# Patient Record
Sex: Male | Born: 1950 | Race: Black or African American | Hispanic: No | Marital: Married | Smoking: Current every day smoker
Health system: Southern US, Community
[De-identification: ages and names within clinical notes are randomized; demographics above are authoritative.]

## PROBLEM LIST (undated history)

## (undated) DIAGNOSIS — I10 Essential (primary) hypertension: Secondary | ICD-10-CM

## (undated) DIAGNOSIS — E785 Hyperlipidemia, unspecified: Secondary | ICD-10-CM

## (undated) DIAGNOSIS — R7303 Prediabetes: Secondary | ICD-10-CM

---

## 2005-03-15 ENCOUNTER — Emergency Department: Payer: Self-pay | Admitting: Emergency Medicine
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
## 2016-09-14 ENCOUNTER — Ambulatory Visit: Payer: Self-pay | Admitting: Podiatry

## 2016-10-26 ENCOUNTER — Ambulatory Visit: Payer: No Typology Code available for payment source | Admitting: Podiatry

## 2016-11-02 ENCOUNTER — Ambulatory Visit: Payer: No Typology Code available for payment source | Admitting: Podiatry

## 2016-11-09 ENCOUNTER — Encounter: Payer: Self-pay | Admitting: Podiatry

## 2016-11-09 ENCOUNTER — Ambulatory Visit (INDEPENDENT_AMBULATORY_CARE_PROVIDER_SITE_OTHER): Payer: Non-veteran care | Admitting: Podiatry

## 2016-11-09 VITALS — BP 112/76 | HR 73 | Temp 98.6°F | Resp 16

## 2016-11-09 DIAGNOSIS — M79675 Pain in left toe(s): Secondary | ICD-10-CM

## 2016-11-09 DIAGNOSIS — M79674 Pain in right toe(s): Secondary | ICD-10-CM

## 2016-11-09 DIAGNOSIS — B351 Tinea unguium: Secondary | ICD-10-CM

## 2016-11-09 NOTE — Progress Notes (Signed)
   Subjective:    Patient ID: Jared Berry, male    DOB: 1950/12/21, 66 y.o.   MRN: [id number]  HPI this patient presents the office with chief complaint of long thick nails.  Nails are painful walking and wearing his shoes.  He was sent over to from the [place] for treatment of his thickened nails.  He presents the office today for preventative foot care services    Review of Systems  HENT: Positive for tinnitus.   Musculoskeletal: Positive for back pain.  Neurological: Positive for light-headedness.  All other systems reviewed and are negative.      Objective:   Physical Exam General Appearance  Alert, conversant and in no acute stress.  Vascular  Dorsalis pedis and posterior pulses are palpable  bilaterally.  Capillary return is within normal limits  Bilaterally. Temperature is within normal limits  Bilaterally  Neurologic  Senn-Weinstein monofilament wire test within normal limits  bilaterally. Muscle power  Within normal limits bilaterally.  Nails Thick disfigured discolored nails with subungual debride bilaterally from hallux to fifth toes bilaterally. No evidence of bacterial infection or drainage bilaterally.  Orthopedic  No limitations of motion of motion feet bilaterally.  No crepitus or effusions noted.  No bony pathology or digital deformities noted.  Skin  Asymptomatic heel callus  B/L  No signs of infections or ulcers noted.          Assessment & Plan:  Onychomycosis  B/L  IE  Debridement of nails.  RTC 3 months   Gardiner Barefoot DPM

## 2017-02-11 ENCOUNTER — Ambulatory Visit: Payer: No Typology Code available for payment source | Admitting: Podiatry

## 2019-07-25 ENCOUNTER — Other Ambulatory Visit: Payer: Self-pay

## 2019-07-25 ENCOUNTER — Ambulatory Visit (INDEPENDENT_AMBULATORY_CARE_PROVIDER_SITE_OTHER): Payer: Non-veteran care | Admitting: Podiatry

## 2019-07-25 DIAGNOSIS — M7742 Metatarsalgia, left foot: Secondary | ICD-10-CM | POA: Diagnosis not present

## 2019-07-25 DIAGNOSIS — M79674 Pain in right toe(s): Secondary | ICD-10-CM

## 2019-07-25 DIAGNOSIS — M79675 Pain in left toe(s): Secondary | ICD-10-CM | POA: Diagnosis not present

## 2019-07-25 DIAGNOSIS — Q666 Other congenital valgus deformities of feet: Secondary | ICD-10-CM

## 2019-07-25 DIAGNOSIS — B351 Tinea unguium: Secondary | ICD-10-CM | POA: Diagnosis not present

## 2019-07-27 ENCOUNTER — Encounter: Payer: Self-pay | Admitting: Podiatry

## 2019-07-27 NOTE — Progress Notes (Signed)
Subjective:  Patient ID: Jared Berry, male    DOB: [birth year],  MRN: [id number]  Chief Complaint  Patient presents with  . Nail Problem    pt is here for nail fungus of both big toenails, pt states that he also has possible neuropathy of the the left plantar forefoot as well.   69 y.o. male returns for the above complaint.  Patient presents with thickened elongated dystrophic toenails x10.  They are painful to touch.  Painful when ambulating.  He would like to have them debrided down.  He also has secondary complaint of metatarsalgia to the left side.  Patient states he has a lot of pain in the ball of foot.  Especially when pain.  Patient states that he has pain with pressure.  Patient states that 7 out of 10 pain to the left foot.  Patient would also like to discuss orthotics for flatfoot structure.  He denies any other acute complaints  Objective:  There were no vitals filed for this visit. Podiatric Exam: Vascular: dorsalis pedis and posterior tibial pulses are palpable bilateral. Capillary return is immediate. Temperature gradient is WNL. Skin turgor WNL  Sensorium: Normal Semmes Weinstein monofilament test. Normal tactile sensation bilaterally. Nail Exam: Pt has thick disfigured discolored nails with subungual debris noted bilateral entire nail hallux through fifth toenails.  Pain on palpation to the nails. Ulcer Exam: There is no evidence of ulcer or pre-ulcerative changes or infection. Orthopedic Exam: Muscle tone and strength are WNL. No limitations in general ROM. No crepitus or effusions noted. HAV  B/L.  Hammer toes 2-5  B/L.  Severe pes planovalgus deformity noted with calcaneal eversion too many toe sign and partially recreate the arch with dorsiflexion of the hallux.  Pain across the ball of the left foot.  No Mulder's click noted.  No other signs of neuroma noted in any of the interspaces.  No pain with range of motion of the digits 2 through 5. Skin: No Porokeratosis. No  infection or ulcers    Assessment & Plan:   1. Metatarsalgia of left foot   2. Pes planovalgus   3. Pain due to onychomycosis of toenails of both feet     Patient was evaluated and treated and all questions answered.  Left pes planus -I explained to the patient the etiology of pes planus deformity and various treatment options were extensively discussed.  I believe patient will benefit from custom-made orthotics to help support the arch of the foot as well as control the hindfoot motion.  Patient also has metatarsalgia of the left forefoot.  I believe incorporation of the metatarsal pad to the left side is definitely beneficial. -I will have him follow-up with Jared Berry for custom-made orthotics.  Left metatarsalgia -I explained patient the etiology of metatarsalgia and various treatment options were extensively discussed.  I believe patient will benefit from metatarsal pad to take the pressure off of the forefoot and therefore reduce the pain associated with it.  I am not worried about neuroma as clinically patient does not have any Mulder click or any other signs of neuromas in the interspaces. -Metatarsal pad was dispensed  Onychomycosis with pain  -Nails palliatively debrided as below. -Educated on self-care  Procedure: Nail Debridement Rationale: pain  Type of Debridement: manual, sharp debridement. Instrumentation: Nail nipper, rotary burr. Number of Nails: 10  Procedures and Treatment: Consent by patient was obtained for treatment procedures. The patient understood the discussion of treatment and procedures well. All questions were answered  thoroughly reviewed. Debridement of mycotic and hypertrophic toenails, 1 through 5 bilateral and clearing of subungual debris. No ulceration, no infection noted.  Return Visit-Office Procedure: Patient instructed to return to the office for a follow up visit 3 months for continued evaluation and treatment.  Jared Berry, DPM    No follow-ups  on file.

## 2019-08-09 ENCOUNTER — Ambulatory Visit: Payer: Non-veteran care | Admitting: Orthotics

## 2019-08-09 ENCOUNTER — Other Ambulatory Visit: Payer: Self-pay

## 2019-08-09 DIAGNOSIS — M7742 Metatarsalgia, left foot: Secondary | ICD-10-CM

## 2019-08-09 DIAGNOSIS — Q666 Other congenital valgus deformities of feet: Secondary | ICD-10-CM

## 2019-08-09 NOTE — Progress Notes (Signed)
Patient advised to contact [facility] department for Auth to get foot orthotics from [place] or [facility].

## 2019-08-15 ENCOUNTER — Ambulatory Visit (INDEPENDENT_AMBULATORY_CARE_PROVIDER_SITE_OTHER): Payer: Non-veteran care | Admitting: Podiatry

## 2019-08-15 ENCOUNTER — Other Ambulatory Visit: Payer: Self-pay

## 2019-08-15 DIAGNOSIS — L603 Nail dystrophy: Secondary | ICD-10-CM

## 2019-08-15 DIAGNOSIS — M79609 Pain in unspecified limb: Secondary | ICD-10-CM

## 2019-08-15 DIAGNOSIS — B351 Tinea unguium: Secondary | ICD-10-CM | POA: Diagnosis not present

## 2019-08-18 ENCOUNTER — Encounter: Payer: Self-pay | Admitting: Podiatry

## 2019-08-18 NOTE — Progress Notes (Signed)
  Subjective:  Patient ID: Jared Berry, male    DOB: May 02, 1950,  MRN: [id number]  Chief Complaint  Patient presents with  . Nail Problem    pt is here for a f/u of foot pain to the left foot.    69 y.o. male presents with the above complaint. Patient presents with thickened elongated dystrophic nail of the left hallux. Patient states that it has been causing him a lot of pain. He would like to remove the nail. He would also like to make apartment as this has been dealing with it for quite some time. Patient states he had a history of nail that was removed and it came right back. He denies any other acute complaints.   Review of Systems: Negative except as noted in the HPI. Denies N/V/F/Ch.  No past medical history on file. No current outpatient medications on file.  Social History   Tobacco Use  Smoking Status Current Every Day Smoker  . Packs/day: 2.00  . Types: Cigarettes  Smokeless Tobacco Never Used    No Known Allergies Objective:  There were no vitals filed for this visit. There is no height or weight on file to calculate BMI. Constitutional Well developed. Well nourished.  Vascular Dorsalis pedis pulses palpable bilaterally. Posterior tibial pulses palpable bilaterally. Capillary refill normal to all digits.  No cyanosis or clubbing noted. Pedal hair growth normal.  Neurologic Normal speech. Oriented to person, place, and time. Epicritic sensation to light touch grossly present bilaterally.  Dermatologic Pain on palpation of the entire/total nail on 1st digit of the left No other open wounds. No skin lesions.  Orthopedic: Normal joint ROM without pain or crepitus bilaterally. No visible deformities. No bony tenderness.   Radiographs: None Assessment:   1. Nail dystrophy   2. Pain due to onychomycosis of nail    Plan:  Patient was evaluated and treated and all questions answered.  Nail contusion/dystrophy hallux, left with underlying  onychomycosis -Patient elects to proceed with minor surgery to remove entire toenail today. Consent reviewed and signed by patient. -Entire/total nail excised. See procedure note. -Educated on post-procedure care including soaking. Written instructions provided and reviewed. -Patient to follow up in 2 weeks for nail check.  Procedure: Excision of entire/total nail with matricectomy Location: Left 1st toe digit Anesthesia: Lidocaine 1% plain; 1.5 mL and Marcaine 0.5% plain; 1.5 mL, digital block. Skin Prep: Betadine. Dressing: Silvadene; telfa; dry, sterile, compression dressing. Technique: Following skin prep, the toe was exsanguinated and a tourniquet was secured at the base of the toe. The affected nail border was freed and excised. Phenol matricectomy was performed in standard technique without complication. The tourniquet was then removed and sterile dressing applied. Disposition: Patient tolerated procedure well. Patient to return in 2 weeks for follow-up.   No follow-ups on file.

## 2019-10-26 ENCOUNTER — Encounter: Payer: Self-pay | Admitting: Podiatry

## 2019-10-26 ENCOUNTER — Other Ambulatory Visit: Payer: Self-pay

## 2019-10-26 ENCOUNTER — Ambulatory Visit (INDEPENDENT_AMBULATORY_CARE_PROVIDER_SITE_OTHER): Payer: No Typology Code available for payment source | Admitting: Podiatry

## 2019-10-26 DIAGNOSIS — L603 Nail dystrophy: Secondary | ICD-10-CM | POA: Diagnosis not present

## 2019-10-26 DIAGNOSIS — B351 Tinea unguium: Secondary | ICD-10-CM | POA: Diagnosis not present

## 2019-10-26 DIAGNOSIS — M722 Plantar fascial fibromatosis: Secondary | ICD-10-CM | POA: Diagnosis not present

## 2019-10-26 DIAGNOSIS — M79676 Pain in unspecified toe(s): Secondary | ICD-10-CM

## 2019-10-27 ENCOUNTER — Encounter: Payer: Self-pay | Admitting: Podiatry

## 2019-10-27 ENCOUNTER — Telehealth: Payer: Self-pay | Admitting: Podiatry

## 2019-10-27 NOTE — Telephone Encounter (Signed)
[facility] referral extension faxed on 10/26/19

## 2019-10-27 NOTE — Progress Notes (Signed)
  Subjective:  Patient ID: Jared Berry, male    DOB: Mar 29, 1950,  MRN: [id number]  Chief Complaint  Patient presents with  . Nail Problem    nail trim RFC    69 y.o. male presents with the above complaint.  Patient presents with complaint of left distal plantar fascial pain.  Patient states is very tight he can feel the band.  He states it hurts with ambulating it hurts right there.  He has not tried anything.  He is done some stretching exercises which has not helped.  He also has secondary complaint of thickened elongated dystrophic toenails x10.  He would like to have them debrided down as he is not able to do it himself.  He denies any other acute complaints.   Review of Systems: Negative except as noted in the HPI. Denies N/V/F/Ch.  No past medical history on file. No current outpatient medications on file.  Social History   Tobacco Use  Smoking Status Current Every Day Smoker  . Packs/day: 2.00  . Types: Cigarettes  Smokeless Tobacco Never Used    No Known Allergies Objective:  There were no vitals filed for this visit. There is no height or weight on file to calculate BMI. Constitutional Well developed. Well nourished.  Vascular Dorsalis pedis pulses palpable bilaterally. Posterior tibial pulses palpable bilaterally. Capillary refill normal to all digits.  No cyanosis or clubbing noted. Pedal hair growth normal.  Neurologic Normal speech. Oriented to person, place, and time. Epicritic sensation to light touch grossly present bilaterally.  Dermatologic Nail Exam: Pt has thick disfigured discolored nails with subungual debris noted bilateral entire nail hallux through fifth toenails.  Pain on palpation to the nails. No open wounds. No skin lesions.  Orthopedic: Normal joint ROM without pain or crepitus bilaterally. No visible deformities. Tender to palpation at the distal band of the plantar fascia left. No pain with calcaneal squeeze left. Ankle ROM diminished  range of motion left. Silfverskiold Test: positive left.   Radiographs: TNone  Assessment:  No diagnosis found. Plan:  Patient was evaluated and treated and all questions answered.  Plantar Fasciitis, left distal band - XR reviewed as above.  - Educated on icing and stretching. Instructions given.  - Injection delivered to the plantar fascia as below. - DME: None - Pharmacologic management: None  Procedure: Injection Tendon/Ligament Location: Left plantar fascia at the glabrous junction; medial approach. Skin Prep: alcohol Injectate: 0.5 cc 0.5% marcaine plain, 0.5 cc of 1% Lidocaine, 0.5 cc kenalog 10. Disposition: Patient tolerated procedure well. Injection site dressed with a band-aid.  Onychomycosis with pain with underlying nail dystrophy -Nails palliatively debrided as below. -Educated on self-care  Procedure: Nail Debridement Rationale: pain  Type of Debridement: manual, sharp debridement. Instrumentation: Nail nipper, rotary burr. Number of Nails: 10  Procedures and Treatment: Consent by patient was obtained for treatment procedures. The patient understood the discussion of treatment and procedures well. All questions were answered thoroughly reviewed. Debridement of mycotic and hypertrophic toenails, 1 through 5 bilateral and clearing of subungual debris. No ulceration, no infection noted.  Return Visit-Office Procedure: Patient instructed to return to the office for a follow up visit 3 months for continued evaluation and treatment.  Boneta Lucks, DPM    No follow-ups on file.    No follow-ups on file.

## 2019-12-26 ENCOUNTER — Telehealth: Payer: Self-pay | Admitting: Podiatry

## 2019-12-26 NOTE — Telephone Encounter (Signed)
Patient called in wanting appointment before referral ran out through the [place]. Estill Bamberg wanted me to let you know patient needs to be followed up because the referral isn't in the system for [place] to glance at, please advise

## 2019-12-27 ENCOUNTER — Ambulatory Visit: Payer: Non-veteran care | Admitting: Podiatry

## 2020-01-01 ENCOUNTER — Telehealth: Payer: Self-pay | Admitting: *Deleted

## 2020-01-01 NOTE — Telephone Encounter (Signed)
I left Jared Berry a message that his [employer] authorization has expired.  I informed him that we had sent a request in September but we still haven't received the authorization from them.  I explained that I faxed the request again today.  I informed him that he needs to initiate the request as well.  I reminded him that the authorization is required prior to his scheduled appointment for Wednesday.

## 2020-01-01 NOTE — Telephone Encounter (Signed)
"  I'm trying to check on me not running out of my [facility].  I'm trying to get another appointment before the time ran out."

## 2020-01-03 ENCOUNTER — Ambulatory Visit: Payer: Non-veteran care | Admitting: Podiatry

## 2020-01-03 NOTE — Telephone Encounter (Signed)
"  I got a call about my appointment today.  I don't have authorization from the [place]."  Yes, I left you a message and asked you to give the [place] a call.  Did you call them?  "No, I didn't."  I attempted to assist you by sending a request to the [place] but I haven't heard anything.  So, do you want to cancel your appointment for today.  "Yes, I can't afford to pay for it right now.  I'll call the [facility].  Once I get it authorized, I'll give you a call."

## 2020-01-25 ENCOUNTER — Ambulatory Visit: Payer: Non-veteran care | Admitting: Podiatry

## 2020-12-16 ENCOUNTER — Ambulatory Visit (INDEPENDENT_AMBULATORY_CARE_PROVIDER_SITE_OTHER): Payer: No Typology Code available for payment source | Admitting: Podiatry

## 2020-12-16 ENCOUNTER — Encounter: Payer: Self-pay | Admitting: Podiatry

## 2020-12-16 ENCOUNTER — Other Ambulatory Visit: Payer: Self-pay

## 2020-12-16 ENCOUNTER — Ambulatory Visit (INDEPENDENT_AMBULATORY_CARE_PROVIDER_SITE_OTHER): Payer: No Typology Code available for payment source

## 2020-12-16 DIAGNOSIS — D4101 Neoplasm of uncertain behavior of right kidney: Secondary | ICD-10-CM | POA: Insufficient documentation

## 2020-12-16 DIAGNOSIS — M778 Other enthesopathies, not elsewhere classified: Secondary | ICD-10-CM

## 2020-12-16 DIAGNOSIS — Z122 Encounter for screening for malignant neoplasm of respiratory organs: Secondary | ICD-10-CM | POA: Insufficient documentation

## 2020-12-16 DIAGNOSIS — R7303 Prediabetes: Secondary | ICD-10-CM | POA: Insufficient documentation

## 2020-12-16 DIAGNOSIS — K649 Unspecified hemorrhoids: Secondary | ICD-10-CM | POA: Insufficient documentation

## 2020-12-16 DIAGNOSIS — F17200 Nicotine dependence, unspecified, uncomplicated: Secondary | ICD-10-CM | POA: Insufficient documentation

## 2020-12-16 DIAGNOSIS — N281 Cyst of kidney, acquired: Secondary | ICD-10-CM | POA: Insufficient documentation

## 2020-12-16 DIAGNOSIS — R948 Abnormal results of function studies of other organs and systems: Secondary | ICD-10-CM | POA: Insufficient documentation

## 2020-12-16 DIAGNOSIS — H9319 Tinnitus, unspecified ear: Secondary | ICD-10-CM | POA: Insufficient documentation

## 2020-12-16 DIAGNOSIS — Z720 Tobacco use: Secondary | ICD-10-CM | POA: Insufficient documentation

## 2020-12-16 DIAGNOSIS — D2122 Benign neoplasm of connective and other soft tissue of left lower limb, including hip: Secondary | ICD-10-CM | POA: Diagnosis not present

## 2020-12-16 DIAGNOSIS — I25119 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: Secondary | ICD-10-CM | POA: Insufficient documentation

## 2020-12-16 DIAGNOSIS — G510 Bell's palsy: Secondary | ICD-10-CM | POA: Insufficient documentation

## 2020-12-16 DIAGNOSIS — Z8611 Personal history of tuberculosis: Secondary | ICD-10-CM | POA: Insufficient documentation

## 2020-12-16 DIAGNOSIS — IMO0002 Reserved for concepts with insufficient information to code with codable children: Secondary | ICD-10-CM | POA: Insufficient documentation

## 2020-12-16 DIAGNOSIS — M25552 Pain in left hip: Secondary | ICD-10-CM | POA: Insufficient documentation

## 2020-12-16 DIAGNOSIS — N529 Male erectile dysfunction, unspecified: Secondary | ICD-10-CM | POA: Insufficient documentation

## 2020-12-16 DIAGNOSIS — H2513 Age-related nuclear cataract, bilateral: Secondary | ICD-10-CM | POA: Insufficient documentation

## 2020-12-16 DIAGNOSIS — L408 Other psoriasis: Secondary | ICD-10-CM | POA: Insufficient documentation

## 2020-12-16 MED ORDER — TRIAMCINOLONE ACETONIDE 40 MG/ML IJ SUSP
40.0000 mg | Freq: Once | INTRAMUSCULAR | Status: AC
  Administered 2020-12-16: 40 mg

## 2020-12-16 NOTE — Progress Notes (Signed)
  Subjective:  Patient ID: Jared Berry, male    DOB: [birth year],  MRN: [id number] HPI Chief Complaint  Patient presents with   Foot Pain    Plantar forefoot left - aching, numbness x 1.5 year, using muscle rub and soaking   New Patient (Initial Visit)    70 y.o. male presents with the above complaint.   ROS: Denies fever chills nausea vomiting muscle aches pains calf pain back pain chest pain shortness of breath.  No past medical history on file. No past surgical history on file.  Current Outpatient Medications:    Multiple Vitamin (MULTIVITAMIN) capsule, Take 1 capsule by mouth daily., Disp: , Rfl:   No Known Allergies Review of Systems Objective:  There were no vitals filed for this visit.  General: Well developed, nourished, in no acute distress, alert and oriented x3   Dermatological: Skin is warm, dry and supple bilateral. Nails x 10 are well maintained; remaining integument appears unremarkable at this time. There are no open sores, no preulcerative lesions, no rash or signs of infection present.  Vascular: Dorsalis Pedis artery and Posterior Tibial artery pedal pulses are 2/4 bilateral with immedate capillary fill time. Pedal hair growth present. No varicosities and no lower extremity edema present bilateral.   Neruologic: Grossly intact via light touch bilateral. Vibratory intact via tuning fork bilateral. Protective threshold with Semmes Wienstein monofilament intact to all pedal sites bilateral. Patellar and Achilles deep tendon reflexes 2+ bilateral. No Babinski or clonus noted bilateral.   Musculoskeletal: No gross boney pedal deformities bilateral. No pain, crepitus, or limitation noted with foot and ankle range of motion bilateral. Muscular strength 5/5 in all groups tested bilateral.  Pain on palpation and end range of motion of the second metatarsophalangeal joint of the left foot.  Gait: Unassisted, Nonantalgic.    Radiographs:  Radiographs taken today do  not demonstrate any type of acute osseous abnormalities.  Does demonstrate an osseously mature individual.  Assessment & Plan:   Assessment: Probable capsulitis of the second metatarsal phalangeal joint with some neuropathic changes secondary to duration and inflammation around the joint.  Plan: Discussed appropriate shoe gear stretching exercise ice therapy shoe gear modifications injected around the joint with 10 mg Kenalog 5 mg Marcaine to the point maximal tenderness.  Tolerated procedure well I will follow-up with him as needed.     Lawerence Dery T. [place], [place]

## 2021-02-07 ENCOUNTER — Telehealth: Payer: Self-pay | Admitting: *Deleted

## 2021-02-07 NOTE — Telephone Encounter (Signed)
"  I want to see Dr. Milinda Pointer for a foot appointment as soon as possible."

## 2021-02-26 ENCOUNTER — Ambulatory Visit (INDEPENDENT_AMBULATORY_CARE_PROVIDER_SITE_OTHER): Payer: No Typology Code available for payment source | Admitting: Podiatry

## 2021-02-26 ENCOUNTER — Other Ambulatory Visit: Payer: Self-pay

## 2021-02-26 ENCOUNTER — Encounter: Payer: Self-pay | Admitting: Podiatry

## 2021-02-26 DIAGNOSIS — M778 Other enthesopathies, not elsewhere classified: Secondary | ICD-10-CM

## 2021-02-26 DIAGNOSIS — G5782 Other specified mononeuropathies of left lower limb: Secondary | ICD-10-CM

## 2021-02-26 MED ORDER — TRIAMCINOLONE ACETONIDE 40 MG/ML IJ SUSP
40.0000 mg | Freq: Once | INTRAMUSCULAR | Status: AC
  Administered 2021-02-26: 40 mg

## 2021-02-26 NOTE — Progress Notes (Signed)
He presents today for follow-up of his left foot and pain in it.  He states he is feeling much better than it was previously.  Objective: Vital signs are stable he is alert oriented x3 he has pain on palpation third interdigital space of the left foot consistent with neuroma and he also has tenderness on palpation at the level of the second metatarsal phalangeal joint dorsally.  This is consistent with capsulitis.  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  Plan: I injected the capsulitis today with 2 mg of dexamethasone local anesthetic while injecting the third interdigital space with 10 mg of Kenalog and local anesthetic.  I will follow-up with him in a few weeks to make sure he is healing well.  We did discuss appropriate shoe gear and I will follow-up with him at that time

## 2021-05-28 ENCOUNTER — Ambulatory Visit: Payer: No Typology Code available for payment source | Admitting: Podiatry

## 2021-06-30 ENCOUNTER — Ambulatory Visit: Payer: Self-pay | Admitting: Podiatry

## 2022-05-27 ENCOUNTER — Telehealth: Payer: Self-pay | Admitting: *Deleted

## 2022-06-23 NOTE — Telephone Encounter (Signed)
"  I'd like to see what I have to do to get my medical records for the [place]."

## 2024-02-14 ENCOUNTER — Emergency Department

## 2024-02-14 ENCOUNTER — Observation Stay

## 2024-02-14 ENCOUNTER — Observation Stay
Admission: EM | Admit: 2024-02-14 | Discharge: 2024-02-17 | Disposition: A | Discharge status: Home / Self Care | Attending: Student | Admitting: Student

## 2024-02-14 ENCOUNTER — Other Ambulatory Visit: Payer: Self-pay

## 2024-02-14 DIAGNOSIS — Z79899 Other long term (current) drug therapy: Secondary | ICD-10-CM | POA: Diagnosis not present

## 2024-02-14 DIAGNOSIS — R42 Dizziness and giddiness: Secondary | ICD-10-CM

## 2024-02-14 DIAGNOSIS — F17200 Nicotine dependence, unspecified, uncomplicated: Secondary | ICD-10-CM | POA: Diagnosis present

## 2024-02-14 DIAGNOSIS — I25119 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: Secondary | ICD-10-CM | POA: Diagnosis not present

## 2024-02-14 DIAGNOSIS — G459 Transient cerebral ischemic attack, unspecified: Secondary | ICD-10-CM | POA: Diagnosis not present

## 2024-02-14 DIAGNOSIS — R29704 NIHSS score 4: Secondary | ICD-10-CM

## 2024-02-14 DIAGNOSIS — R7303 Prediabetes: Secondary | ICD-10-CM | POA: Diagnosis present

## 2024-02-14 DIAGNOSIS — I479 Paroxysmal tachycardia, unspecified: Secondary | ICD-10-CM | POA: Insufficient documentation

## 2024-02-14 DIAGNOSIS — R2990 Unspecified symptoms and signs involving the nervous system: Secondary | ICD-10-CM | POA: Diagnosis present

## 2024-02-14 DIAGNOSIS — F17210 Nicotine dependence, cigarettes, uncomplicated: Secondary | ICD-10-CM | POA: Diagnosis not present

## 2024-02-14 DIAGNOSIS — M5416 Radiculopathy, lumbar region: Secondary | ICD-10-CM | POA: Diagnosis not present

## 2024-02-14 DIAGNOSIS — I639 Cerebral infarction, unspecified: Secondary | ICD-10-CM | POA: Diagnosis not present

## 2024-02-14 DIAGNOSIS — R531 Weakness: Secondary | ICD-10-CM | POA: Diagnosis present

## 2024-02-14 DIAGNOSIS — I10 Essential (primary) hypertension: Secondary | ICD-10-CM | POA: Insufficient documentation

## 2024-02-14 DIAGNOSIS — I4719 Other supraventricular tachycardia: Secondary | ICD-10-CM

## 2024-02-14 DIAGNOSIS — R29818 Other symptoms and signs involving the nervous system: Secondary | ICD-10-CM | POA: Diagnosis not present

## 2024-02-14 DIAGNOSIS — R350 Frequency of micturition: Secondary | ICD-10-CM | POA: Diagnosis not present

## 2024-02-14 HISTORY — DX: Essential (primary) hypertension: I10

## 2024-02-14 HISTORY — DX: Hyperlipidemia, unspecified: E78.5

## 2024-02-14 HISTORY — DX: Prediabetes: R73.03

## 2024-02-14 LAB — POTASSIUM: Potassium: 3.7 mmol/L (ref 3.5–5.1)

## 2024-02-14 LAB — DIFFERENTIAL
Abs Immature Granulocytes: 0.02 K/uL (ref 0.00–0.07)
Basophils Absolute: 0 K/uL (ref 0.0–0.1)
Basophils Relative: 1 %
Eosinophils Absolute: 0.1 K/uL (ref 0.0–0.5)
Eosinophils Relative: 1 %
Immature Granulocytes: 0 %
Lymphocytes Relative: 10 %
Lymphs Abs: 0.6 K/uL — ABNORMAL LOW (ref 0.7–4.0)
Monocytes Absolute: 0.7 K/uL (ref 0.1–1.0)
Monocytes Relative: 11 %
Neutro Abs: 4.6 K/uL (ref 1.7–7.7)
Neutrophils Relative %: 77 %

## 2024-02-14 LAB — COMPREHENSIVE METABOLIC PANEL WITH GFR
ALT: 8 U/L (ref 0–44)
AST: 30 U/L (ref 15–41)
Albumin: 3.6 g/dL (ref 3.5–5.0)
Alkaline Phosphatase: 77 U/L (ref 38–126)
Anion gap: 14 (ref 5–15)
BUN: 14 mg/dL (ref 8–23)
CO2: 22 mmol/L (ref 22–32)
Calcium: 9 mg/dL (ref 8.9–10.3)
Chloride: 100 mmol/L (ref 98–111)
Creatinine, Ser: 1.31 mg/dL — ABNORMAL HIGH (ref 0.61–1.24)
GFR, Estimated: 58 mL/min — ABNORMAL LOW
Glucose, Bld: 105 mg/dL — ABNORMAL HIGH (ref 70–99)
Potassium: 5.2 mmol/L — ABNORMAL HIGH (ref 3.5–5.1)
Sodium: 136 mmol/L (ref 135–145)
Total Bilirubin: 0.6 mg/dL (ref 0.0–1.2)
Total Protein: 7.2 g/dL (ref 6.5–8.1)

## 2024-02-14 LAB — URINALYSIS, W/ REFLEX TO CULTURE (INFECTION SUSPECTED)
Bacteria, UA: NONE SEEN
Bilirubin Urine: NEGATIVE
Glucose, UA: NEGATIVE mg/dL
Ketones, ur: 20 mg/dL — AB
Leukocytes,Ua: NEGATIVE
Nitrite: NEGATIVE
Protein, ur: NEGATIVE mg/dL
Specific Gravity, Urine: 1.023 (ref 1.005–1.030)
Squamous Epithelial / HPF: 0 /HPF (ref 0–5)
pH: 5 (ref 5.0–8.0)

## 2024-02-14 LAB — ETHANOL: Alcohol, Ethyl (B): <15 mg/dL

## 2024-02-14 LAB — CBC
HCT: 43.5 % (ref 39.0–52.0)
Hemoglobin: 14.5 g/dL (ref 13.0–17.0)
MCH: 29.7 pg (ref 26.0–34.0)
MCHC: 33.3 g/dL (ref 30.0–36.0)
MCV: 89.1 fL (ref 80.0–100.0)
Platelets: 230 K/uL (ref 150–400)
RBC: 4.88 MIL/uL (ref 4.22–5.81)
RDW: 15.9 % — ABNORMAL HIGH (ref 11.5–15.5)
WBC: 5.9 K/uL (ref 4.0–10.5)
nRBC: 0 % (ref 0.0–0.2)

## 2024-02-14 LAB — PROTIME-INR
INR: 1 (ref 0.8–1.2)
Prothrombin Time: 14 s (ref 11.4–15.2)

## 2024-02-14 LAB — URINE DRUG SCREEN
Amphetamines: NEGATIVE
Barbiturates: NEGATIVE
Benzodiazepines: POSITIVE — AB
Cocaine: NEGATIVE
Fentanyl: NEGATIVE
Methadone Scn, Ur: NEGATIVE
Opiates: NEGATIVE
Tetrahydrocannabinol: POSITIVE — AB

## 2024-02-14 LAB — APTT: aPTT: 36 s (ref 24–36)

## 2024-02-14 LAB — CK: Total CK: 158 U/L (ref 49–397)

## 2024-02-14 MED ORDER — SODIUM ZIRCONIUM CYCLOSILICATE 10 G PO PACK
10.0000 g | PACK | Freq: Once | ORAL | Status: AC
  Administered 2024-02-14: 10 g via ORAL
  Filled 2024-02-14: qty 1

## 2024-02-14 MED ORDER — NICOTINE 14 MG/24HR TD PT24
14.0000 mg | MEDICATED_PATCH | Freq: Every day | TRANSDERMAL | Status: DC
  Administered 2024-02-14 – 2024-02-17 (×4): 14 mg via TRANSDERMAL
  Filled 2024-02-14 (×4): qty 1

## 2024-02-14 MED ORDER — SODIUM CHLORIDE 0.9% FLUSH: 3.0000 mL | Freq: Once | INTRAVENOUS | Status: DC

## 2024-02-14 MED ORDER — STROKE: EARLY STAGES OF RECOVERY BOOK: Freq: Once | Status: AC

## 2024-02-14 MED ORDER — HEPARIN SODIUM (PORCINE) 5000 UNIT/ML IJ SOLN
5000.0000 [IU] | Freq: Three times a day (TID) | INTRAMUSCULAR | Status: DC
  Administered 2024-02-14 – 2024-02-15 (×3): 5000 [IU] via SUBCUTANEOUS
  Filled 2024-02-14 (×3): qty 1

## 2024-02-14 MED ORDER — SODIUM CHLORIDE 0.9 % IV BOLUS
1000.0000 mL | Freq: Once | INTRAVENOUS | Status: AC
  Administered 2024-02-14: 1000 mL via INTRAVENOUS

## 2024-02-14 MED ORDER — LACTATED RINGERS IV SOLN: INTRAVENOUS | Status: AC

## 2024-02-14 MED ORDER — IOHEXOL 350 MG/ML SOLN
75.0000 mL | Freq: Once | INTRAVENOUS | Status: AC | PRN
  Administered 2024-02-14: 75 mL via INTRAVENOUS

## 2024-02-14 NOTE — Progress Notes (Signed)
" °   02/14/24 1325  Spiritual Encounters  Type of Visit Attempt (pt unavailable)   Pt in CT when chaplain arrived and no family present "

## 2024-02-14 NOTE — ED Notes (Signed)
 Pt assisted to use urinal. 2x assist to help pt sit up and get back in bed

## 2024-02-14 NOTE — ED Triage Notes (Signed)
 Pt to ED via [facility] from home. On EMS arrival EMS requesting EDP to evaluate pt. Pt reports woke up at 0530 and 0600 became weak especially in right leg. EMS reports initially pt was walking to truck without assistance and felt his right leg became weaker in route. Pt reports on blood thinners. CODE STROKE called by EDP on arrival and pt taken to CT 2   CBG 111 134/97 92 HR  94% RA

## 2024-02-14 NOTE — Consult Note (Signed)
 NEUROLOGY CONSULT NOTE   Date of service: February 14, 2024 Patient Name: Jared Berry MRN:  [id number] DOB:  1950/09/30 Chief Complaint: Right leg weakness Requesting Provider: Suzanne Kirsch, MD  History of Present Illness  Jared Berry is a 74 y.o. male with no listed PMHx who presents from home after his right leg became weak, feeling as though it was going to give way from underneath him. The patient's LKN was sometime between 0500 and 0730, when he sat down on his porch. When he got up to stand, he noticed the symptoms. He denies any right arm weakness. Does not endorse any sensory numbness or new vision loss.   LKW: Between 0500 and 0730 Modified rankin score: 0 IV Thrombolysis: No: Outside of the time window EVT:  No: Presentation is not consistent with LVO   NIHSS components Score: Comment  1a Level of Conscious 0[x]  1[]  2[]  3[]      1b LOC Questions 0[]  1[x]  2[]      Knows his age. Gave February as the month. Answers Tuesday for the day of the week and 1925 for the year.   1c LOC Commands 0[x]  1[]  2[]       2 Best Gaze 0[x]  1[]  2[]       3 Visual 0[x]  1[]  2[]  3[]      4 Facial Palsy 0[x]  1[]  2[]  3[]      5a Motor Arm - left 0[x]  1[]  2[]  3[]  4[]  UN[]    5b Motor Arm - Right 0[x]  1[]  2[]  3[]  4[]  UN[]    6a Motor Leg - Left 0[]  1[x]  2[]  3[]  4[]  UN[]   Slight drift  6b Motor Leg - Right 0[]  1[x]  2[]  3[]  4[]  UN[]   Slight drift  7 Limb Ataxia 0[x]  1[]  2[]  UN[]      8 Sensory 0[]  1[x]  2[]  UN[]     Decreased FT sensation to LUE. Intact FT sensation to bilateral face and legs  9 Best Language 0[x]  1[]  2[]  3[]      10 Dysarthria 0[x]  1[]  2[]  UN[]      11 Extinct. and Inattention 0[x]  1[]  2[]       TOTAL:   4      ROS  Comprehensive ROS performed and pertinent positives documented in HPI    Past History  No past medical history on file.  No past surgical history on file.  Family History: No family history on file.  Social History  reports that he has been smoking  cigarettes. He has never used smokeless tobacco. He reports that he does not drink alcohol and does not use drugs.  Allergies[1]  Medications  Current Medications[2]  Vitals    BP (!) 134/97   Pulse 92   Resp 20   SpO2 94%    Physical Exam   Constitutional: Appears well-developed and well-nourished.  Psych: Affect appropriate to situation.  Eyes: No scleral injection.  HENT: No OP obstruction.  Head: Normocephalic.  Respiratory: Effort normal, non-labored breathing.  Skin: WDI.   Neurologic Examination   See NIHSS   Labs/Imaging/Neurodiagnostic studies    ASSESSMENT  Jared Berry is a 74 y.o. male presenting with acute onset of RLE weakness.  - Exam reveals slight drift of BLE and mild asymmetry of strength in lower extremities against resistance, rated as 4+/5 strength in knee extension and hip flexion on the left, 4/5 KE and HF on the right. There is mildly decreased FT sensation to his LEFT upper extremity.  - CT head:  No acute intracranial abnormality. ASPECTS of 10. Mild to  moderate chronic small vessel ischemic disease. - EKG: Sinus rhythm Ventricular premature complex LAE, consider biatrial enlargement LAD, consider left anterior fascicular block Anteroseptal infarct, age indeterminate Baseline wander in lead(s) V5 V6 - Impression: Acute onset of RLE weakness. DDx includes stroke, TIA and right lumbar radiculopathy  RECOMMENDATIONS  - MRI brain - If renal function is acceptable, obtain CTA of head and neck, otherwise, obtain MRA of head and carotid ultrasound - TTE - Cardiac telemetry - ASA 325 mg po x 1 now, then 81 mg po every day - Consider starting a statin pending fasting lipid panel - CK level - Permissive HTN x 24 hours - IVF - If MRI brain is negative, obtain MRI of L-spine to assess for possible radiculopathy - Also obtain plain films of right hip as patient endorses right hip pain with movement.  - PT/OT/Speech - HgbA1c - Frequent neuro  checks - NPO until passes stroke swallow screen ______________________________________________________________________    Bonney SHARK, Luka Stohr, MD [facility]     [1] No Known Allergies [2]  Current Facility-Administered Medications:    sodium chloride  flush (NS) 0.9 % injection 3 mL, 3 mL, Intravenous, Once, Mumma, Shannon, MD  Current Outpatient Medications:    Multiple Vitamin (MULTIVITAMIN) capsule, Take 1 capsule by mouth daily., Disp: , Rfl:

## 2024-02-14 NOTE — ED Provider Notes (Signed)
 "  Operating Room Services Provider Note    Event Date/Time   First MD Initiated Contact with Patient 02/14/24 1328     (approximate)   History   Code Stroke   HPI  Baldwin C Peart is a 74 y.o. male past medical history significant for hypertension, who presents to the emergency department for weakness.  Patient states that he went to stand up today after walking out to get to the porch and had a sudden onset of weakness.  Feels significantly more weak in his right leg when compared to his left leg.  Denies any falls or trauma.  States that he woke up this morning when in his normal state of health and around 5:00 this morning.  Walked outside 730 this morning.  Unclear if the patient then walked outside at 1030 or not.  Neighbor called because they found him laying down on the front porch.  Denies any head injury or loss of consciousness.  Complaining of weakness.  EMS stated that they initially took a glucose in his fingers and it was 33 but then checked in his earlobes and it was 111.  Patient states that he is on anticoagulation but unable to state what medications he takes.  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  Also states that he has a history of Bell's palsy and a facial droop to his right side.  States that he was going to the [place] today to have a routine blood pressure check.     Physical Exam   Triage Vital Signs: ED Triage Vitals  Encounter Vitals Group     BP      Girls Systolic BP Percentile      Girls Diastolic BP Percentile      Boys Systolic BP Percentile      Boys Diastolic BP Percentile      Pulse      Resp      Temp      Temp src      SpO2      Weight      Height      Head Circumference      Peak Flow      Pain Score      Pain Loc      Pain Education      Exclude from Growth Chart     Most recent vital signs: Vitals:   02/14/24 1333 02/14/24 1411  BP: (!) 134/97   Pulse: 92   Resp:  20   Temp:  98.1 F (36.7 C)  SpO2: 94%     Physical Exam Constitutional:      Appearance: He is well-developed.  HENT:     Head: Atraumatic.  Eyes:     Conjunctiva/sclera: Conjunctivae normal.     Comments: Unreactive left pupil right pupil equal and reactive  Cardiovascular:     Rate and Rhythm: Regular rhythm.  Pulmonary:     Effort: No respiratory distress.  Musculoskeletal:        General: Normal range of motion.     Cervical back: Normal range of motion.  Skin:    General: Skin is warm.     Capillary Refill: Capillary refill takes less than 2 seconds.  Neurological:     Mental Status: He is alert. Mental status is at baseline.     Cranial Nerves: Facial asymmetry present.     Sensory: No sensory deficit.  Motor: Weakness present.     Coordination: Coordination is intact.     Comments: Facial symmetry to the right side with a droop. (Baseline per the patient).  Drift to the right lower extremity when compared to the left lower extremity     IMPRESSION / MDM / ASSESSMENT AND PLAN / ED COURSE  I reviewed the triage vital signs and the nursing notes.  Differential diagnosis including concern for code stroke.  Last known well likely at 5:00 this morning when he got up.  Questionably on anticoagulation.  Activated code stroke and patient immediately taken back to CT scanner from EMS scanner.  Repeat glucose within normal limits.  Differential diagnosis including CVA, intracranial hemorrhage, electrolyte abnormality, dehydration, pneumonia, viral illness including COVID/influenza, urinary tract infection, dehydration  On arrival afebrile, hemodynamically stable.  EKG  I, Clotilda Punter, the attending physician, personally viewed and interpreted this ECG.  EKG showed normal sinus rhythm.  Appears to have atrial enlargement.  Significant artifact to the lateral leads.  No significant ST elevation or depression.  No findings of acute ischemia or dysrhythmia.  No  tachycardic or bradycardic dysrhythmias while on cardiac telemetry.  RADIOLOGY I independently reviewed imaging, my interpretation of imaging: CT scan of the head read as no acute findings  LABS (all labs ordered are listed, but only abnormal results are displayed) Labs interpreted as -    Labs Reviewed  CBC - Abnormal; Notable for the following components:      Result Value   RDW 15.9 (*)    All other components within normal limits  DIFFERENTIAL - Abnormal; Notable for the following components:   Lymphs Abs 0.6 (*)    All other components within normal limits  PROTIME-INR  APTT  COMPREHENSIVE METABOLIC PANEL WITH GFR  ETHANOL  URINE DRUG SCREEN  URINALYSIS, W/ REFLEX TO CULTURE (INFECTION SUSPECTED)  CK  I-STAT CREATININE, ED  CBG MONITORING, ED     MDM   Clinical Course as of 02/14/24 1458  Mon Feb 14, 2024  1348 Evaluated the patient in the hallway for sudden onset of weakness in the right leg when walking outside.  On my evaluation patient with intact sensation to bilateral upper and lower extremities.  Drift to the right lower extremity that was worse than the left lower extremity.  No pronator drift to the upper extremities.  Left pupil minimally reactive which she states this is baseline and is prior trauma.  Facial droop with asymmetry to the right face which she states is from a prior history of Bell's palsy.  Activated code stroke given his last known well was at 730 this morning with new right lower leg weakness.  Mediately taken back to CT scanner. [SM]    Clinical Course User Index [SM] Punter Clotilda, MD   No significant leukocytosis or anemia.  Lab work otherwise in process.  Patient was evaluated by neurology Dr. Merrianne -recommended no TNK given questionable last known well.  V AN negative and no concern for LVO, no thrombectomy candidate.  Did not feel that he needs an emergent CTA.  Reading neurology note recommended MRI and CTA and further stroke  workup.    PROCEDURES:  Critical Care performed: yes  .Critical Care  Performed by: Punter Clotilda, MD Authorized by: Punter Clotilda, MD   Critical care provider statement:    Critical care time (minutes):  30   Critical care time was exclusive of:  Separately billable procedures and treating other patients   Critical care  was necessary to treat or prevent imminent or life-threatening deterioration of the following conditions:  CNS failure or compromise   Critical care was time spent personally by me on the following activities:  Development of treatment plan with patient or surrogate, discussions with consultants, evaluation of patient's response to treatment, examination of patient, ordering and review of laboratory studies, ordering and review of radiographic studies, ordering and performing treatments and interventions, pulse oximetry, re-evaluation of patient's condition and review of old charts   Patient's presentation is most consistent with acute presentation with potential threat to life or bodily function.   MEDICATIONS ORDERED IN ED: Medications  sodium chloride  flush (NS) 0.9 % injection 3 mL ( Intravenous Canceled Entry 02/14/24 1338)  sodium chloride  0.9 % bolus 1,000 mL (has no administration in time range)    FINAL CLINICAL IMPRESSION(S) / ED DIAGNOSES   Final diagnoses:  Weakness     Rx / DC Orders   ED Discharge Orders     None        Note:  This document was prepared using Dragon voice recognition software and may include unintentional dictation errors.   Suzanne Kirsch, MD 02/14/24 1458  "

## 2024-02-14 NOTE — ED Notes (Addendum)
 1x attempt to call pt's sister Jared Berry. Voicebox was full to leave callback number

## 2024-02-14 NOTE — Progress Notes (Signed)
 CODE STROKE- PHARMACY COMMUNICATION   Time CODE STROKE called/page received: 1322  Time response to CODE STROKE was made (in person or via phone): immediately  Time Stroke Kit retrieved from [employer] (only if needed): n/a  Name of Provider/Nurse contacted:Dr. Merrianne  History reviewed. No pertinent past medical history. Prior to Admission medications  Medication Sig Start Date End Date Taking? Authorizing Provider  Multiple Vitamin (MULTIVITAMIN) capsule Take 1 capsule by mouth daily.   Yes [provider]  nicotine  (NICODERM CQ  - DOSED IN MG/24 HR) 7 mg/24hr patch Place 7 mg onto the skin daily. 01/10/24  Yes [provider]  amLODipine -benazepril (LOTREL) 5-10 MG capsule Take 1 capsule by mouth daily. Patient not taking: Reported on 02/14/2024 04/15/23   [provider]    Leonor JAYSON Argyle ,PharmD 02/14/2024  2:15 PM

## 2024-02-14 NOTE — H&P (Signed)
 " History and Physical    [place] FMW:[id number] DOB: 19-Jan-1951 DOA: 02/14/2024  DOS: the patient was seen and examined on 02/14/2024  PCP: System, Provider Not In   Patient coming from: Home  I have personally briefly reviewed patient's old medical records in [facility] and CareEverywhere  HPI:   Jared Berry is a 74 y.o. year old male with medical history of hypertension, hyperlipidemia, prediabetes, tobacco use disorder presented to the ED with right leg weakness.  States this occurred after waking up this morning.  This was reported as an acute change; stroke was initiated.  CT head was without any acute findings.  Patient was outside of any acute intervention and his symptoms were not consistent with LVO.  Neurology evaluated the patient and recommended admission for further workup.  Lab work showed CBC being unremarkable.  CMP with mild hyperkalemia, creatinine at 1.31 with baseline unknown.  CK normal.  UA without signs of infection, ethanol level normal.  CT head without any acute changes but mild to moderate chronic small vessel ischemic disease.  Given need for further workup, [facility] contacted for admission.  Review of Systems: As mentioned in the history of present illness. All other systems reviewed and are negative.   History reviewed. No pertinent past medical history.  History reviewed. No pertinent surgical history.   Allergies[1]  History reviewed. No pertinent family history.  Prior to Admission medications  Medication Sig Start Date End Date Taking? Authorizing Provider  Multiple Vitamin (MULTIVITAMIN) capsule Take 1 capsule by mouth daily.   Yes [provider]  nicotine  (NICODERM CQ  - DOSED IN MG/24 HR) 7 mg/24hr patch Place 7 mg onto the skin daily. 01/10/24  Yes [provider]  amLODipine -benazepril (LOTREL) 5-10 MG capsule Take 1 capsule by mouth daily. Patient not taking: Reported on 02/14/2024 04/15/23   [provider]     Social History:  reports that he has been smoking cigarettes. He has never used smokeless tobacco. He reports that he does not drink alcohol and does not use drugs. Lives by himself, smokes 2 packs/day, does not drink alcohol.  Independent ADLs and IADLs.   Physical Exam: Vitals:   02/14/24 1333 02/14/24 1400 02/14/24 1411  BP: (!) 134/97    Pulse: 92    Resp: 20    Temp:   98.1 F (36.7 C)  TempSrc:   Oral  SpO2: 94%    Weight:  81.4 kg     Gen: NAD HENT: NCAT CV: normal heart sounds Lung: CTAB Abd: No TTP, normal bowel sounds MSK: No asymmetry, good bulk and tone Neuro: alert and oriented, 4/5 upper and lower extremity strength.  Sensation intact.  No focal deficits appreciated.   Labs on Admission: I have personally reviewed following labs and imaging studies  CBC: Recent Labs  Lab 02/14/24 1322  WBC 5.9  NEUTROABS 4.6  HGB 14.5  HCT 43.5  MCV 89.1  PLT 230   Basic Metabolic Panel: Recent Labs  Lab 02/14/24 1322  NA 136  K 5.2*  CL 100  CO2 22  GLUCOSE 105*  BUN 14  CREATININE 1.31*  CALCIUM  9.0   GFR: CrCl cannot be calculated (Unknown ideal weight.). Liver Function Tests: Recent Labs  Lab 02/14/24 1322  AST 30  ALT 8  ALKPHOS 77  BILITOT 0.6  PROT 7.2  ALBUMIN 3.6   No results for input(s): LIPASE, AMYLASE in the last 168 hours. No results for input(s): AMMONIA in the last  168 hours. Coagulation Profile: Recent Labs  Lab 02/14/24 1322  INR 1.0   Cardiac Enzymes: Recent Labs  Lab 02/14/24 1322  CKTOTAL 158   BNP (last 3 results) No results for input(s): BNP in the last 8760 hours. HbA1C: No results for input(s): HGBA1C in the last 72 hours. CBG: No results for input(s): GLUCAP in the last 168 hours. Lipid Profile: No results for input(s): CHOL, HDL, LDLCALC, TRIG, CHOLHDL, LDLDIRECT in the last 72 hours. Thyroid Function Tests: No results for input(s): TSH, T4TOTAL, FREET4, T3FREE,  THYROIDAB in the last 72 hours. Anemia Panel: No results for input(s): VITAMINB12, FOLATE, FERRITIN, TIBC, IRON, RETICCTPCT in the last 72 hours. Urine analysis:    Component Value Date/Time   COLORURINE YELLOW (A) 02/14/2024 1538   APPEARANCEUR CLEAR (A) 02/14/2024 1538   LABSPEC 1.023 02/14/2024 1538   PHURINE 5.0 02/14/2024 1538   GLUCOSEU NEGATIVE 02/14/2024 1538   HGBUR MODERATE (A) 02/14/2024 1538   BILIRUBINUR NEGATIVE 02/14/2024 1538   KETONESUR 20 (A) 02/14/2024 1538   PROTEINUR NEGATIVE 02/14/2024 1538   NITRITE NEGATIVE 02/14/2024 1538   LEUKOCYTESUR NEGATIVE 02/14/2024 1538    Radiological Exams on Admission: I have personally reviewed images CT HEAD CODE STROKE WO CONTRAST Result Date: 02/14/2024 EXAM: CT HEAD WITHOUT CONTRAST 02/14/2024 01:25:57 PM TECHNIQUE: CT of the head was performed without the administration of intravenous contrast. Automated exposure control, iterative reconstruction, and/or weight based adjustment of the mA/kV was utilized to reduce the radiation dose to as low as reasonably achievable. COMPARISON: None available. CLINICAL HISTORY: Neuro deficit, acute, stroke suspected. FINDINGS: BRAIN AND VENTRICLES: There is no evidence of an acute infarct, intracranial hemorrhage, mass, midline shift, or extra-axial fluid collection. Mild to moderate enlargement of the lateral and third ventricles without significant temporal horn dilatation is favored to reflect central predominant cerebral atrophy rather than hydrocephalus. Patchy cerebral white matter hypodensities are nonspecific but compatible with mild to moderate chronic small vessel ischemic disease. ORBITS: No acute abnormality. SINUSES: Mild mucosal thickening in the paranasal sinuses. Clear mastoid air cells. SOFT TISSUES AND SKULL: No acute soft tissue abnormality. No skull fracture. [place] Stroke Program Early CT Score (ASPECTS) ----- Ganglionic (caudate, IC, lentiform nucleus, insula, M1-M3):  7 Supraganglionic (M4-M6): 3 Total: 10 IMPRESSION: 1. No acute intracranial abnormality. ASPECTS of 10. 2. Mild to moderate chronic small vessel ischemic disease. 3. These results were communicated to Dr. CHARLENA Shark at 1:43 PM on 02/14/2024 by secure text page via the [facility] messaging system. 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  No focal weakness appreciated so more consistent with TIA if imaging is negative. Neurology consulted, appreciate their recommendations.  Per neurology, will obtain lumbar spine MRI if MRI of the brain is negative.   - Allow for permissive HTN in the setting of suspected acute stroke versus TIA - ASA 325 mg once, and then daily afterwards - High Intensity Statin - Echocardiogram  - Carotid doppler or CTA head & neck  - A1C  - Lipid panel  - Tele monitoring  - SLP eval - PT/OT  Tobacco use  disorder: 2 packs/day.  Advised on smoking cessation.  Will order nicotine  replacement therapy.    Prediabetes: Monitor CBG daily.  CAD: High intensity statin as above.  Hypertension: Permissive hypertension as above, holding home antihypertensive.   VTE prophylaxis:  SQ Heparin   Diet: N.p.o. Code Status:  Full Code Telemetry:  Admission status: Observation, Telemetry bed Patient is from: Home Anticipated d/c is to: Home Anticipated d/c is in: 1-2 days   Family Communication: Updated at bedside  Consults called: Neurology   Severity of Illness: The appropriate patient status for this patient is OBSERVATION.  Observation status is judged to be reasonable and necessary in order to provide the required intensity of service to ensure the patient's safety. The patient's presenting symptoms, physical exam findings, and initial radiographic and laboratory data in the context of their medical condition is felt to place them at decreased risk for further clinical deterioration. Furthermore, it is anticipated that the patient will be medically stable for discharge from the hospital within 2 midnights of admission.    Morene Bathe, MD [facility] DEL. [facility]     [1] No Known Allergies  "

## 2024-02-14 NOTE — ED Notes (Signed)
 Pt's sister Dometrice updated

## 2024-02-14 NOTE — Code Documentation (Signed)
 Stroke Response Nurse Documentation Code Documentation  Elva C Manley is a 74 y.o. male arriving to [facility] via [facility] on 02/14/2024 with past medical hx of current smoker and left eye trauma. Per EMS, and pt, he takes anticoagulant, but unsure what it is. Code stroke was activated by ED.   Patient from home where he was LKW unclear, between 0500 and 0730 and now complaining of right leg weakness. Patient woke up normal at 0500. EMS called by neighbor. Noted to have more weakness in right leg. Patient endorses that he was normal when he woke up at 0500 and noticed he was more weak around 0600 when he just got weak in the knees.  Endorses right knee weakness on exam.  Stroke team meets patient in CT. Labs drawn and patient cleared for CT by Dr. Suzanne. Patient to CT with team. NIHSS 4, see documentation for details and code stroke times. Patient with disoriented, bilateral leg weakness, and left decreased sensation on exam. The following imaging was completed:  CT Head. Patient is not a candidate for IV Thrombolytic due to outside window, per MD. Patient is not a candidate for IR due to exam not consistent with LVO, per MD.   Care Plan: every 2 hours NIHSS, swallow screen per order.   Process Delays Noted: n/a  Bedside handoff with ED RN Burnard MICAEL Burnard KANDICE Hershel  Stroke Response RN

## 2024-02-14 NOTE — ED Notes (Signed)
Code  stroke  called  to  carelink per  Dr  Jori Moll  MD

## 2024-02-15 ENCOUNTER — Observation Stay
Admit: 2024-02-15 | Discharge: 2024-02-15 | Disposition: A | Discharge status: Home / Self Care | Attending: Internal Medicine | Admitting: Internal Medicine

## 2024-02-15 ENCOUNTER — Observation Stay

## 2024-02-15 DIAGNOSIS — R531 Weakness: Principal | ICD-10-CM

## 2024-02-15 LAB — BASIC METABOLIC PANEL WITH GFR
Anion gap: 14 (ref 5–15)
BUN: 11 mg/dL (ref 8–23)
CO2: 22 mmol/L (ref 22–32)
Calcium: 9.6 mg/dL (ref 8.9–10.3)
Chloride: 99 mmol/L (ref 98–111)
Creatinine, Ser: 1.08 mg/dL (ref 0.61–1.24)
GFR, Estimated: >60 mL/min
Glucose, Bld: 130 mg/dL — ABNORMAL HIGH (ref 70–99)
Potassium: 4 mmol/L (ref 3.5–5.1)
Sodium: 135 mmol/L (ref 135–145)

## 2024-02-15 LAB — ECHOCARDIOGRAM COMPLETE BUBBLE STUDY
AR max vel: 3.28 cm2
AV Area VTI: 3.14 cm2
AV Area mean vel: 2.93 cm2
AV Mean grad: 2 mmHg
AV Peak grad: 4.1 mmHg
Ao pk vel: 1.01 m/s
Area-P 1/2: 7.99 cm2
S' Lateral: 2.6 cm

## 2024-02-15 LAB — PHOSPHORUS: Phosphorus: 2.1 mg/dL — ABNORMAL LOW (ref 2.5–4.6)

## 2024-02-15 LAB — URINALYSIS, COMPLETE (UACMP) WITH MICROSCOPIC
Bilirubin Urine: NEGATIVE
Glucose, UA: NEGATIVE mg/dL
Ketones, ur: NEGATIVE mg/dL
Leukocytes,Ua: NEGATIVE
Nitrite: NEGATIVE
Protein, ur: NEGATIVE mg/dL
Specific Gravity, Urine: 1.011 (ref 1.005–1.030)
pH: 5 (ref 5.0–8.0)

## 2024-02-15 LAB — TSH: TSH: 2.18 u[IU]/mL (ref 0.350–4.500)

## 2024-02-15 LAB — LIPID PANEL
Cholesterol: 141 mg/dL (ref 0–200)
HDL: 33 mg/dL — ABNORMAL LOW
LDL Cholesterol: 91 mg/dL (ref 0–99)
Total CHOL/HDL Ratio: 4.2 ratio
Triglycerides: 82 mg/dL
VLDL: 16 mg/dL (ref 0–40)

## 2024-02-15 LAB — MAGNESIUM: Magnesium: 2 mg/dL (ref 1.7–2.4)

## 2024-02-15 LAB — HEMOGLOBIN A1C
Hgb A1c MFr Bld: 5.6 % (ref 4.8–5.6)
Mean Plasma Glucose: 114.02 mg/dL

## 2024-02-15 MED ORDER — ADENOSINE 6 MG/2ML IV SOLN
6.0000 mg | Freq: Once | INTRAVENOUS | Status: DC
  Filled 2024-02-15: qty 2

## 2024-02-15 MED ORDER — ROSUVASTATIN CALCIUM 20 MG PO TABS
20.0000 mg | ORAL_TABLET | Freq: Every day | ORAL | Status: DC
  Administered 2024-02-15 – 2024-02-16 (×2): 20 mg via ORAL
  Filled 2024-02-15 (×2): qty 1

## 2024-02-15 MED ORDER — K PHOS MONO-SOD PHOS DI & MONO 155-852-130 MG PO TABS
500.0000 mg | ORAL_TABLET | Freq: Three times a day (TID) | ORAL | Status: AC
  Administered 2024-02-15 (×3): 500 mg via ORAL
  Filled 2024-02-15 (×3): qty 2

## 2024-02-15 MED ORDER — ASPIRIN 325 MG PO TABS
325.0000 mg | ORAL_TABLET | Freq: Once | ORAL | Status: AC
  Administered 2024-02-15: 325 mg via ORAL
  Filled 2024-02-15: qty 1

## 2024-02-15 MED ORDER — ENOXAPARIN SODIUM 40 MG/0.4ML IJ SOSY
40.0000 mg | PREFILLED_SYRINGE | Freq: Every evening | INTRAMUSCULAR | Status: DC
  Administered 2024-02-15 – 2024-02-16 (×2): 40 mg via SUBCUTANEOUS
  Filled 2024-02-15 (×2): qty 0.4

## 2024-02-15 MED ORDER — AMLODIPINE BESYLATE 5 MG PO TABS
5.0000 mg | ORAL_TABLET | Freq: Every day | ORAL | Status: DC
  Administered 2024-02-15: 5 mg via ORAL
  Filled 2024-02-15 (×2): qty 1

## 2024-02-15 MED ORDER — ASPIRIN 81 MG PO TBEC
81.0000 mg | DELAYED_RELEASE_TABLET | Freq: Every day | ORAL | Status: DC
  Administered 2024-02-16 – 2024-02-17 (×2): 81 mg via ORAL
  Filled 2024-02-15 (×2): qty 1

## 2024-02-15 NOTE — Plan of Care (Signed)

## 2024-02-15 NOTE — Progress Notes (Signed)
*  PRELIMINARY RESULTS* Echocardiogram 2D Echocardiogram has been performed.  Jared Berry 02/15/2024, 10:36 AM

## 2024-02-15 NOTE — Progress Notes (Signed)
 [facility] Progress Note  Patient: Jared Berry    FMW:[id number]  DOA: 02/14/2024     Date of Service: the patient was seen and examined on 02/15/2024  Chief Complaint  Patient presents with   Code Stroke   Brief hospital course:  Jared Berry is a 74 y.o. year old male with medical history of hypertension, hyperlipidemia, prediabetes, tobacco use disorder presented to the ED with right leg weakness.  States this occurred after waking up this morning.  This was reported as an acute change; stroke was initiated.  CT head was without any acute findings.  Patient was outside of any acute intervention and his symptoms were not consistent with LVO.  Neurology evaluated the patient and recommended admission for further workup.  Lab work showed CBC being unremarkable.  CMP with mild hyperkalemia, creatinine at 1.31 with baseline unknown.  CK normal.  UA without signs of infection, ethanol level normal.  CT head without any acute changes but mild to moderate chronic small vessel ischemic disease.  Given need for further workup, [facility] contacted for admission.     Assessment and Plan:  # Right lower extremity weakness Acute CVA ruled out, MRI negative  Pt with symptoms of weakness that started acutely and concern for stroke versus TIA.  No focal weakness appreciated so more consistent with TIA if imaging is negative. Neurology consulted, appreciate their recommendations.  - Allow for permissive HTN in the setting of suspected acute stroke versus TIA - ASA 325 mg once, continue aspirin  81 mg and Crestor  20 mg p.o. daily - High Intensity Statin - Echocardiogram LVEF 60-65%, no WMA, grade 1 DD, negative PFO - CTA head & neck: Extensive aortic arch ulceration soft plaque without dissection.  Negative LVO - A1C 5.6 nondiabetic range - Lipid panel LDL 91 - Tele monitoring  - SLP eval - PT/OT MRI L-spine: 1. Right eccentric disc bulge at L5-S1 contacts the descending right S1 nerve root, with  mild right greater than left neural foraminal narrowing. 2. Incompletely evaluated right renal and hepatic T2 hyperintensities, which may represent cysts. Follow spine surgery, secure chat done with Dr. Katrina     Tobacco use disorder: 2 packs/day.  Advised on smoking cessation.  Will order nicotine  replacement therapy.     Prediabetes: Monitor CBG daily.   CAD: High intensity statin as above.   Hypertension: Patient was not taking any medication at home 1/6 started amlodipine  5 mg p.o. daily  Hypophosphatemia, Phos repleted. Monitor electrolytes daily and replete as needed.  Urinary frequency, patient is asking to check his urine Follow UA and urine culture Check bladder scan to rule out urinary retention and overflow incontinence   Body mass index is 24.04 kg/m.  Interventions:   Diet: Heart healthy diet DVT Prophylaxis: Subcutaneous Lovenox    Advance goals of care discussion: Full code  Family Communication: family was not present at bedside, at the time of interview.  The pt provided permission to discuss medical plan with the family. Opportunity was given to ask question and all questions were answered satisfactorily.   Disposition:  Pt is from Home, admitted with right lower extremity weakness, still has weakness, which precludes a safe discharge. Discharge to home with home PT versus SNF, TBD after PT and OT eval, when stable, most likely tomorrow a.m. if cleared by spine surgery.  Subjective: No significant events overnight.  Patient is still complaining of weakness in the right lower extremity and feels that it is because of his spine  problem.  Denied any other complaints.  Physical Exam: General: NAD, lying comfortably Appear in no distress, affect appropriate Eyes: PERRLA ENT: Oral Mucosa Clear, moist  Neck: no JVD,  Cardiovascular: S1 and S2 Present, no Murmur,  Respiratory: good respiratory effort, Bilateral Air entry equal and Decreased, no  Crackles, no wheezes Abdomen: Bowel Sound present, Soft and no tenderness,  Skin: no rashes Extremities: no Pedal edema, no calf tenderness Neurologic: RLE power 4/5, no any other focal deficits Gait not checked due to patient safety concerns  Vitals:   02/15/24 0000 02/15/24 0400 02/15/24 0800 02/15/24 1200  BP: 136/64 (!) 143/79 (!) 149/76 (!) 148/85  Pulse:  71 70 72  Resp: 18 19  16   Temp: 98.9 F (37.2 C) 98.8 F (37.1 C) 99.3 F (37.4 C) 98.9 F (37.2 C)  TempSrc: Oral  Oral Oral  SpO2: 96% 93% 94% 98%  Weight:      Height:        Intake/Output Summary (Last 24 hours) at 02/15/2024 1508 Last data filed at 02/15/2024 0900 Gross per 24 hour  Intake 120 ml  Output --  Net 120 ml   Filed Weights   02/14/24 1400 02/14/24 2005  Weight: 81.4 kg 78.2 kg    Data Reviewed: I have personally reviewed and interpreted daily labs, tele strips, imagings as discussed above. I reviewed all nursing notes, pharmacy notes, vitals, pertinent old records I have discussed plan of care as described above with RN and patient/family.  CBC: Recent Labs  Lab 02/14/24 1322  WBC 5.9  NEUTROABS 4.6  HGB 14.5  HCT 43.5  MCV 89.1  PLT 230   Basic Metabolic Panel: Recent Labs  Lab 02/14/24 1322 02/14/24 2319 02/15/24 0945  NA 136  --  135  K 5.2* 3.7 4.0  CL 100  --  99  CO2 22  --  22  GLUCOSE 105*  --  130*  BUN 14  --  11  CREATININE 1.31*  --  1.08  CALCIUM  9.0  --  9.6  MG  --   --  2.0  PHOS  --   --  2.1*    Studies: ECHOCARDIOGRAM COMPLETE BUBBLE STUDY Result Date: 02/15/2024    ECHOCARDIOGRAM REPORT   Patient Name:   Jared Berry Date of Exam: 02/15/2024 Medical Rec #:  [id number]       Height:       71.0 in Accession #:    [id number]      Weight:       172.4 lb Date of Birth:  07-29-1950       BSA:          1.980 m Patient Age:    73 years        BP:           143/79 mmHg Patient Gender: M               HR:           71 bpm. Exam Location:  [facility] Procedure: 2D Echo,  Cardiac Doppler, Color Doppler and Saline Contrast Bubble            Study (Both Spectral and Color Flow Doppler were utilized during            procedure).                                 MODIFIED  REPORT:   This report was modified by Cara JONETTA Lovelace MD on 02/15/2024 due to Negative                                  bubble study.  Indications:     Stroke 434.91 / I63.9  History:         Patient has no prior history of Echocardiogram examinations. No                  medical histories on file.  Sonographer:     Christopher Furnace Referring Phys:  [id number] [facility] Diagnosing Phys: Cara JONETTA Lovelace MD  Sonographer Comments: Technically challenging study due to limited acoustic windows and no subcostal window. IMPRESSIONS  1. Left ventricular ejection fraction, by estimation, is 60 to 65%. The left ventricle has normal function. The left ventricle has no regional wall motion abnormalities. Left ventricular diastolic parameters are consistent with Grade I diastolic dysfunction (impaired relaxation).  2. Right ventricular systolic function is normal. The right ventricular size is normal.  3. The mitral valve is normal in structure. Trivial mitral valve regurgitation.  4. The aortic valve is normal in structure. Aortic valve regurgitation is not visualized.  5. Agitated saline contrast bubble study was negative, with no evidence of any interatrial shunt. FINDINGS  Left Ventricle: Left ventricular ejection fraction, by estimation, is 60 to 65%. The left ventricle has normal function. The left ventricle has no regional wall motion abnormalities. Strain was performed and the global longitudinal strain is indeterminate. The left ventricular internal cavity size was normal in size. There is no left ventricular hypertrophy. Left ventricular diastolic parameters are consistent with Grade I diastolic dysfunction (impaired relaxation). Right Ventricle: The right ventricular size is normal. No increase in right ventricular wall  thickness. Right ventricular systolic function is normal. Left Atrium: Left atrial size was normal in size. Right Atrium: Right atrial size was normal in size. Pericardium: There is no evidence of pericardial effusion. Mitral Valve: The mitral valve is normal in structure. Trivial mitral valve regurgitation. Tricuspid Valve: The tricuspid valve is normal in structure. Tricuspid valve regurgitation is trivial. Aortic Valve: The aortic valve is normal in structure. Aortic valve regurgitation is not visualized. Aortic valve mean gradient measures 2.0 mmHg. Aortic valve peak gradient measures 4.1 mmHg. Aortic valve area, by VTI measures 3.14 cm. Pulmonic Valve: The pulmonic valve was normal in structure. Pulmonic valve regurgitation is not visualized. Aorta: The ascending aorta was not well visualized. IAS/Shunts: No atrial level shunt detected by color flow Doppler. Agitated saline contrast was given intravenously to evaluate for intracardiac shunting. Agitated saline contrast bubble study was negative, with no evidence of any interatrial shunt. Additional Comments: 3D was performed not requiring image post processing on an independent workstation and was indeterminate.  LEFT VENTRICLE PLAX 2D LVIDd:         4.00 cm   Diastology LVIDs:         2.60 cm   LV e' medial:    11.40 cm/s LV PW:         1.10 cm   LV E/e' medial:  8.3 LV IVS:        1.20 cm   LV e' lateral:   19.60 cm/s LVOT diam:     2.30 cm   LV E/e' lateral: 4.8 LV SV:         33 LV SV Index:  17 LVOT Area:     4.15 cm  RIGHT VENTRICLE RV Basal diam:  2.90 cm RV Mid diam:    2.50 cm RV S prime:     11.60 cm/s TAPSE (M-mode): 2.5 cm LEFT ATRIUM             Index        RIGHT ATRIUM           Index LA Vol (A2C):   22.6 ml 11.42 ml/m  RA Area:     18.90 cm LA Vol (A4C):   16.0 ml 8.08 ml/m   RA Volume:   52.80 ml  26.67 ml/m LA Biplane Vol: 19.7 ml 9.95 ml/m  AORTIC VALVE AV Area (Vmax):    3.28 cm AV Area (Vmean):   2.93 cm AV Area (VTI):     3.14  cm AV Vmax:           101.00 cm/s AV Vmean:          70.600 cm/s AV VTI:            0.104 m AV Peak Grad:      4.1 mmHg AV Mean Grad:      2.0 mmHg LVOT Vmax:         79.70 cm/s LVOT Vmean:        49.800 cm/s LVOT VTI:          0.079 m LVOT/AV VTI ratio: 0.76  AORTA Ao Root diam: 4.20 cm MITRAL VALVE                TRICUSPID VALVE MV Area (PHT): 7.99 cm     TR Peak grad:   28.1 mmHg MV Decel Time: 95 msec      TR Vmax:        265.00 cm/s MV E velocity: 94.90 cm/s MV A velocity: 122.00 cm/s  SHUNTS MV E/A ratio:  0.78         Systemic VTI:  0.08 m                             Systemic Diam: 2.30 cm Cara JONETTA Lovelace MD Electronically signed by Cara JONETTA Lovelace MD Signature Date/Time: 02/15/2024/12:38:41 PM    Final (Updated)    MR LUMBAR SPINE WO CONTRAST Result Date: 02/15/2024 EXAM: MRI LUMBAR SPINE 02/15/2024 11:06:36 AM TECHNIQUE: Multiplanar multisequence MRI of the lumbar spine was performed without the administration of intravenous contrast. COMPARISON: None available. CLINICAL HISTORY: Lumbar radiculopathy, no red flags, no prior management. FINDINGS: BONES AND ALIGNMENT: There is straightening of the normal lumbar lordosis without significant vertebral column listhesis. There is mild intervertebral disc height and signal loss most pronounced at L5-S1. Normal vertebral body heights. Bone marrow signal is unremarkable. SPINAL CORD: The conus medullaris terminates at the L1-L2 level. SOFT TISSUES: No paraspinal mass. L1-L2: No significant disc herniation. No spinal canal stenosis or neural foraminal narrowing. Mild bilateral facet arthropathy. L2-L3: There is minimal disc bulge and mild bilateral facet arthropathy without significant spinal canal or neural foraminal stenosis. L3-L4: There is minimal disc bulge and mild bilateral facet arthropathy without significant spinal stenosis. There is mild bilateral neural foraminal narrowing. L4-L5: There is disc bulge slightly eccentric to the right and mild  bilateral facet arthropathy without significant spinal stenosis. There is mild bilateral neural foraminal narrowing. L5-S1: There is disc bulge eccentric to the right with contact of the descending right S1 nerve roots. Mild bilateral  facet arthropathy. No significant spinal stenosis. There is mild right greater than left bilateral neural foraminal narrowing. KIDNEYS: Large right renal T2 hyperintensity may represent a cyst. LIVER: Partially evaluated the right hepatic T2 hyperintensity which may represent a cyst. IMPRESSION: 1. Right eccentric disc bulge at L5-S1 contacts the descending right S1 nerve root, with mild right greater than left neural foraminal narrowing. 2. Incompletely evaluated right renal and hepatic T2 hyperintensities, which may represent cysts. Electronically signed by: Prentice Spade MD 02/15/2024 12:31 PM EST RP Workstation: [id number]   CT ANGIO HEAD NECK W WO CM Result Date: 02/15/2024 EXAM: CTA Head and Neck with Intravenous Contrast. CT Head without Contrast. CLINICAL HISTORY: 74 year old male with acute neurological deficit, stroke suspected, presenting with code stroke today. TECHNIQUE: Axial CTA images of the head and neck performed with intravenous contrast. MIP reconstructed images were created and reviewed. Axial computed tomography images of the head/brain performed without intravenous contrast. Note: Per PQRS, the description of internal carotid artery percent stenosis, including 0 percent or normal exam, is based on [employer] American Symptomatic Carotid Endarterectomy Trial (NASCET) criteria. Dose reduction technique was used including one or more of the following: automated exposure control, adjustment of mA and kV according to patient size, and/or iterative reconstruction. CONTRAST: With and without. 75 mL iohexol  (OMNIPAQUE ) 350 MG/ML injection. COMPARISON: CT head and brain MRI from 02/14/2024. FINDINGS: CT HEAD: BRAIN: No acute intraparenchymal hemorrhage. No mass lesion. No  CT evidence for acute territorial infarct. No midline shift or extra-axial collection. VENTRICLES: No hydrocephalus. ORBITS: The orbits are unremarkable. SINUSES AND MASTOIDS: The paranasal sinuses and mastoid air cells are clear. CTA NECK: AORTA: There is extensive soft plaque atherosclerosis of the distal aortic arch including areas of prominence ulcerated plaque as seen on series 4 image 180 and series 8 images 156 and 171. These plaque ulcerations are generally subcentimeter. No arch dissection is identified. Superimposed bovine arch configuration, normal variant. COMMON CAROTID ARTERIES: Tortuous right CCA origin. Tortuous bovine left CCA origin. There is anterior left CCA soft plaque ulceration or small subcentimeter pseudoaneurysm best seen on series 10 image 123. No stenosis or dissection associated. Mild brachiocephalic artery soft plaque without stenosis. No significant stenosis. No dissection or occlusion. INTERNAL CAROTID ARTERIES: Partially retropharyngeal right carotid bifurcation, and motion artifact at the right ICA origin and bulb. But no convincing cervical right ICA atherosclerosis or stenosis. Capacious left carotid bifurcation. Minimal left ICA origin plaque. Tortuous cervical left ICA without stenosis. Right ICA siphon is patent with minimal calcified plaque and no stenosis. Normal right posterior communicating artery origin. Left ICA siphon is patent with minimal calcified plaque and no stenosis. Normal left posterior communicating artery origin. Both ophthalmic artery origins appear normal. No stenosis by NASCET criteria. No dissection or occlusion. VERTEBRAL ARTERIES: No proximal subclavian artery or right vertebral artery origin atherosclerosis or stenosis. Dominant right vertebral artery, mildly ectatic to the vertebrobasilar junction and concordant with MRI brain flow voids yesterday. No right vertebral artery stenosis. Proximal left subclavian artery soft plaque without stenosis. 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 No significant stenosis. No dissection or occlusion. CTA HEAD: ANTERIOR CEREBRAL ARTERIES: Left ACA A1 segment is mildly dominant and there is a median artery of the corpus callosum (normal variations). Normal anterior communicating artery. Normal ACA origins. No significant stenosis. No occlusion. No aneurysm. MIDDLE CEREBRAL ARTERIES: Normal MCA origins. No significant stenosis. No occlusion. No aneurysm.  POSTERIOR CEREBRAL ARTERIES: Fetal type bilateral PCA origins, more so the right. No right PCA stenosis. There is mild to moderate left PCA P2 segment irregularity and stenosis on series 11 image 21. No occlusion. No aneurysm. BASILAR ARTERY: Right vertebral artery supplies the basilar artery without stenosis. Basilar artery is patent without stenosis. Bilateral AICA appear to be dominant. No aneurysm. SOFT TISSUES: Partially retropharyngeal course of the right carotid. Motion artifact at the oropharynx and soft palate. Otherwise nonvascular neck soft tissue spaces are within normal limits. BONES: Dentition is absent. Heterogeneous bone mineralization with no destructive or suspicious osseous lesion identified. No acute osseous abnormality. LUNGS: Mild upper lobe lung scarring and atelectasis. INTRACRANIAL VENOUS: Early intracranial venous contrast timing, the superior sagittal sinus appears to be patent. IMPRESSION: 1. Positive for Extensive distal aortic arch ulcerated soft plaque, without arch dissection (series 4 image 180). And similar anterior left CCA plaque ulceration vs small pseudoaneurysm (series 10 image 123), without dissection or stenosis. 2. Negative for large vessel occlusion , and no significant atherosclerosis or stenosis in the neck. 3. Intracranial atherosclerosis most pronounced in the left PCA p2 segment with mild to moderate irregularity and  stenosis there. Electronically signed by: Helayne Hurst MD 02/15/2024 05:53 AM EST RP Workstation: [id number]   MR BRAIN WO CONTRAST Result Date: 02/14/2024 EXAM: MRI Brain Without Contrast 02/14/2024 09:24:29 PM TECHNIQUE: Multiplanar multisequence MRI of the head/brain was performed without the administration of intravenous contrast. COMPARISON: CT Head earlier today. CLINICAL HISTORY: Stroke, follow up FINDINGS: BRAIN AND VENTRICLES: No acute infarct. No acute intracranial hemorrhage. Small area of superficial siderosis along the right parietal convexity. No mass. No midline shift. Ventriculomegaly, which is mildly out of proportion to sulcal enlargement. The sella is unremarkable. Normal flow voids. ORBITS: No acute abnormality. SINUSES AND MASTOIDS: Mild to moderate paranasal sinus mucosal thickening. BONES AND SOFT TISSUES: Normal marrow signal. No acute soft tissue abnormality. IMPRESSION: 1. No evidence of acute abnormality. 2. Ventriculomegaly, which is mildly out of proportion to sulcal enlargement. This could be due to central predominant atrophy or normal pressure hydrocephalus. 3. Small area of superficial siderosis along the right parietal convexity. Electronically signed by: Gilmore Molt 02/14/2024 09:46 PM EST RP Workstation: [id number]    Scheduled Meds:  [START ON 02/16/2024] aspirin  EC  81 mg Oral Daily   heparin   5,000 Units Subcutaneous Q8H   nicotine   14 mg Transdermal Daily   phosphorus  500 mg Oral TID   rosuvastatin   20 mg Oral Daily   sodium chloride  flush  3 mL Intravenous Once   Continuous Infusions: PRN Meds:   Time spent: 55 minutes  Author: ELVAN SOR. MD [facility] 02/15/2024 3:08 PM  To reach On-call, see care teams to locate the attending and reach out to them via [email or web address]. If 7PM-7AM, please contact night-coverage If you still have difficulty reaching the attending provider, please page the [facility] (Director on Call) for [facility] on amion  for assistance.

## 2024-02-15 NOTE — Progress Notes (Signed)
 OT Cancellation Note  Patient Details Name: Jared Berry MRN: [id number] DOB: [birth year]   Cancelled Treatment:    Reason Eval/Treat Not Completed: Patient at procedure or test/ unavailable. Pt getting echo upon initial attempt, HR in 140's per telemetry. On 2nd attempt, pt out of the room. Will re-attempt at later time as pt is available and medically appropriate.   Blaize Nipper R., MPH, MS, OTR/L ascom [phone] 02/15/2024, 10:45 AM

## 2024-02-15 NOTE — Evaluation (Addendum)
 Physical Therapy Evaluation Patient Details Name: Jared Berry MRN: [id number] DOB: 1950-08-26 Today's Date: 02/15/2024  History of Present Illness  Pt is a 74 yo male that presented to the ED for R leg weakness. PMH of HTN, HLD, prediabetes, tobacco use disorder.  Clinical Impression  Patient alert, agreeable to PT, denied pain, oriented x4. At baseline the pt stated he is independent, lives alone, and often drives his cousin to work. MMT revealed symmetrical strength, at least 4+/5 in BLE. He was able to perform most mobility at a supervision  level, including progressing to ambulating without AD. No LOB noted. The patient demonstrated and reported return to baseline level of functioning, no further acute PT needs indicated. PT to sign off. Please reconsult PT if pt status changes or acute needs are identified.          If plan is discharge home, recommend the following: Other (comment) (NA)   Can travel by private vehicle        Equipment Recommendations None recommended by PT  Recommendations for Other Services       Functional Status Assessment Patient has not had a recent decline in their functional status     Precautions / Restrictions Precautions Precautions: Fall Restrictions Weight Bearing Restrictions Per Provider Order: No      Mobility  Bed Mobility Overal bed mobility: Needs Assistance Bed Mobility: Supine to Sit, Sit to Supine     Supine to sit: Supervision, HOB elevated Sit to supine: Supervision        Transfers Overall transfer level: Needs assistance Equipment used: Rolling walker (2 wheels), None Transfers: Sit to/from Stand             General transfer comment: progressed to supervision without RW    Ambulation/Gait Ambulation/Gait assistance: Supervision Gait Distance (Feet): 200 Feet Assistive device: Rolling walker (2 wheels), None         General Gait Details: very slow gait, but no LOB. pt endorsed he was ambulating at his  normal ambulation  Stairs            Wheelchair Mobility     Tilt Bed    Modified Rankin (Stroke Patients Only)       Balance Overall balance assessment: Needs assistance Sitting-balance support: Feet supported Sitting balance-Leahy Scale: Good     Standing balance support: No upper extremity supported Standing balance-Leahy Scale: Good                               Pertinent Vitals/Pain Pain Assessment Pain Assessment: No/denies pain    Home Living Family/patient expects to be discharged to:: Private residence Living Arrangements: Alone Available Help at Discharge:  (pt referenced a cousin he drives to work) Type of Home: House Home Access: Stairs to enter   Secretary/administrator of Steps: 1   Home Layout: One level Home Equipment: None      Prior Function Prior Level of Function : Independent/Modified Independent                     Extremity/Trunk Assessment   Upper Extremity Assessment Upper Extremity Assessment: Defer to OT evaluation    Lower Extremity Assessment Lower Extremity Assessment: RLE deficits/detail;LLE deficits/detail RLE Deficits / Details: 4+/5 RLE Sensation: WNL RLE Coordination: WNL LLE Deficits / Details: 4+/5 LLE Sensation: WNL LLE Coordination: WNL       Communication        Cognition  Arousal: Alert Behavior During Therapy: WFL for tasks assessed/performed   PT - Cognitive impairments: No apparent impairments                         Following commands: Intact       Cueing       General Comments      Exercises     Assessment/Plan    PT Assessment Patient does not need any further PT services  PT Problem List Decreased balance;Decreased strength;Decreased activity tolerance;Decreased mobility       PT Treatment Interventions DME instruction;Balance training;Gait training;Neuromuscular re-education;Stair training;Functional mobility training;Patient/family  education;Therapeutic activities;Therapeutic exercise    PT Goals (Current goals can be found in the Care Plan section)       Frequency       Co-evaluation               AM-PAC PT 6 Clicks Mobility  Outcome Measure Help needed turning from your back to your side while in a flat bed without using bedrails?: A Little Help needed moving from lying on your back to sitting on the side of a flat bed without using bedrails?: A Little Help needed moving to and from a bed to a chair (including a wheelchair)?: A Little Help needed standing up from a chair using your arms (e.g., wheelchair or bedside chair)?: A Little Help needed to walk in hospital room?: A Little Help needed climbing 3-5 steps with a railing? : A Little 6 Click Score: 18    End of Session Equipment Utilized During Treatment: Gait belt Activity Tolerance: Patient tolerated treatment well Patient left: in bed;with call bell/phone within reach (echo at bedside) Nurse Communication: Mobility status PT Visit Diagnosis: Other abnormalities of gait and mobility (R26.89);Difficulty in walking, not elsewhere classified (R26.2);Muscle weakness (generalized) (M62.81)    Time: 0950-1003 PT Time Calculation (min) (ACUTE ONLY): 13 min   Charges:   PT Evaluation $PT Eval Low Complexity: 1 Low   PT General Charges $$ ACUTE PT VISIT: 1 Visit       Doyal Shams PT, DPT 11:43 AM,02/15/2024

## 2024-02-15 NOTE — Plan of Care (Signed)
 MRI brain: 1. No evidence of acute abnormality. 2. Ventriculomegaly, which is mildly out of proportion to sulcal enlargement. This could be due to central predominant atrophy or normal pressure hydrocephalus. 3. Small area of superficial siderosis along the right parietal convexity.  MRI personally reviewed: Cortical sulci are open on sagittal, coronal and axial views, which is not consistent with NPH. There is also no periependymal signal to suggest any transependymal flow. 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   TTE with bubble study: Pending  Given no stroke on MRI brain, will obtain MRI L-spine to assess for possible right lumbar radiculopathy.   Electronically signed: Dr. Addam Goeller

## 2024-02-15 NOTE — Progress Notes (Addendum)
 SLP Cancellation Note  Patient Details Name: Jared Berry MRN: [id number] DOB: 12-Apr-1950   Cancelled treatment:       Reason Eval/Treat Not Completed: SLP screened, no needs identified, will sign off (SLP consult received and appreciated. Chart review completed. Per most recent Neurology note, Given no stroke on MRI brain, will obtain MRI L-spine to assess for possible right lumbar radiculopathy. No current speech/language/cognitive concerns.)  Jared Berry, M.S., CCC-SLP Speech-Language Pathologist [facility] [phone] [place])  Jared Berry 02/15/2024, 9:04 AM

## 2024-02-15 NOTE — Consult Note (Addendum)
 "   Consult requested by:  Dr. Von  Consult requested for:  RLE weakness  Primary Physician:  System, Provider Not In  History of Present Illness: 02/15/2024 Jared Berry is here with a chief complaint of right leg weakness.  He presents to the emergency department today.  Stroke code was initiated.  No obvious stroke was seen.  He was admitted for evaluation.  Neurology saw him and initiated further workup.  He reports that he has some difficulty with walking.  He has no buttock or leg pain.  He has no numbness or tingling.   He does report some lower back discomfort.  I have utilized the care everywhere function in epic to review the outside records available from external health systems.  Review of Systems:  A 10 point review of systems is negative, except for the pertinent positives and negatives detailed in the HPI.  Past Medical History: History reviewed. No pertinent past medical history.  Past Surgical History: History reviewed. No pertinent surgical history.  Allergies: Allergies as of 02/14/2024   (No Known Allergies)    Medications: Active Medications[1]  Social History: Social History[2]  Family Medical History: History reviewed. No pertinent family history.  Physical Examination: Vitals:   02/15/24 1900 02/15/24 1921  BP: (!) 144/85 123/77  Pulse:  77  Resp:  18  Temp:  99.3 F (37.4 C)  SpO2:  97%    General: Patient is in no apparent distress. Attention to examination is appropriate.  Neck:   Supple.  Full range of motion.  Respiratory: Patient is breathing without any difficulty.   NEUROLOGICAL:     Awake, alert, oriented to person, place, and time.  Speech is clear and fluent.  Cranial Nerves: Pupils equal round and reactive to light.  Facial tone is symmetric.  Facial sensation is symmetric. Shoulder shrug is symmetric. Tongue protrusion is midline.  There is no pronator drift.  Strength: Side Biceps Triceps Deltoid Interossei  Grip Wrist Ext. Wrist Flex.  R 5 5 5 5 5 5 5   L 5 5 5 5 5 5 5    Side Iliopsoas Quads Hamstring PF DF EHL  R 5 5 5 5 5 5   L 5 5 5 5 5 5    Reflexes are 1+ and symmetric at the biceps, triceps, brachioradialis, patella and achilles.   Hoffman's is absent.   Bilateral upper and lower extremity sensation is intact to light touch.    No evidence of dysmetria noted.  Gait is untested.    SLR negative  Medical Decision Making  Imaging: MRI L spine 02/15/2024 IMPRESSION: 1. Right eccentric disc bulge at L5-S1 contacts the descending right S1 nerve root, with mild right greater than left neural foraminal narrowing. 2. Incompletely evaluated right renal and hepatic T2 hyperintensities, which may represent cysts.   Electronically signed by: Prentice Spade MD 02/15/2024 12:31 PM EST RP Workstation: [id number]  I have personally reviewed the images and agree with the above interpretation.  Assessment and Plan: Jared Berry is a pleasant 74 y.o. 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  I do not think the MRI findings are the cause of the weakness that he suffered prior to admission.    I do not recommend any intervention at this time.  If he develops radicular symptoms in the future, I will be happy to see him in the outpatient setting    I have communicated my recommendations to the requesting physician  and coordinated care to facilitate these recommendations.     Jared Berry K. Clois MD, MPHS Neurosurgery     [1]  Current Meds  Medication Sig   Multiple Vitamin (MULTIVITAMIN) capsule Take 1 capsule by mouth daily.   nicotine  (NICODERM CQ  - DOSED IN MG/24 HR) 7 mg/24hr patch Place 7 mg onto the skin daily.  [2]  Social History Tobacco Use   Smoking status: Every Day    Current packs/day: 2.00    Types: Cigarettes   Smokeless tobacco: Never  Substance Use Topics   Alcohol use: No   Drug use: No   "

## 2024-02-15 NOTE — Evaluation (Signed)
 Occupational Therapy Evaluation Patient Details Name: Jared Berry MRN: [id number] DOB: 06-Mar-1950 Today's Date: 02/15/2024   History of Present Illness   Pt is a 74 yo male that presented to the ED for R leg weakness. PMH of HTN, HLD, prediabetes, tobacco use disorder.     Clinical Impressions Pt was seen for OT evaluation this date. Prior to hospital admission, pt was living alone and generally independent per pt report. Friend present and reports she had concerns about him since this past weekend and she was the one who found the pt on the porch. Pt presents with mild deficits in balance and HR in 120's with limited activity. This may be limiting his ability to perform ADL management at baseline level. Pt currently requires increased time/effort to complete grooming at sink, supervision for ADL transfers and mobility without AD.  Pt/friend educated in benefits of a little extra assist at home to support transition to daily routines and strategies to improve safety/access to assist if pt were to need help. Pt/friend verbalized understanding. Do not anticipate the need for follow up OT services upon acute hospital DC.    If plan is discharge home, recommend the following:   Direct supervision/assist for medications management;Assist for transportation;Assistance with cooking/housework     Functional Status Assessment   Patient has had a recent decline in their functional status and demonstrates the ability to make significant improvements in function in a reasonable and predictable amount of time.     Equipment Recommendations   Other (comment) (pt requests 2WW)     Recommendations for Other Services         Precautions/Restrictions   Precautions Precautions: Fall Recall of Precautions/Restrictions: Impaired Restrictions Weight Bearing Restrictions Per Provider Order: No     Mobility Bed Mobility               General bed mobility comments: NT, standing  with nursing upon arrival, in recliner at end of session    Transfers Overall transfer level: Needs assistance Equipment used: None Transfers: Sit to/from Stand Sit to Stand: Supervision           General transfer comment: supv      Balance Overall balance assessment: Needs assistance Sitting-balance support: Feet supported Sitting balance-Leahy Scale: Good     Standing balance support: No upper extremity supported Standing balance-Leahy Scale: Good                             ADL either performed or assessed with clinical judgement   ADL Overall ADL's : Modified independent                                       General ADL Comments: Pt reports donning pants and shoes without assist prior to OT's arrival. Completed grooming at the sink with supervision.     Vision         Perception         Praxis         Pertinent Vitals/Pain Pain Assessment Pain Assessment: No/denies pain     Extremity/Trunk Assessment Upper Extremity Assessment Upper Extremity Assessment: Overall WFL for tasks assessed   Lower Extremity Assessment Lower Extremity Assessment: Defer to PT evaluation RLE Deficits / Details: 4+/5 RLE Sensation: WNL RLE Coordination: WNL LLE Deficits / Details: 4+/5 LLE Sensation: WNL LLE Coordination: WNL  Communication     Cognition Arousal: Alert Behavior During Therapy: WFL for tasks assessed/performed                                 Following commands: Intact       Cueing  General Comments   Cueing Techniques: Verbal cues  HR up to 122 with activity   Exercises Other Exercises Other Exercises: Pt/friend educated in benefits of a little extra assist at home to support transition to daily routines and strategies to improve safety/access to assist if pt were to need help.   Shoulder Instructions      Home Living Family/patient expects to be discharged to:: Private residence Living  Arrangements: Alone Available Help at Discharge: Friend(s) (pt referenced a cousin he drives to work, friend present and verifies that she and pt's sister can also help out) Type of Home: House Home Access: Stairs to enter Secretary/administrator of Steps: 1   Home Layout: One level     Bathroom Shower/Tub: Producer, Television/film/video: Standard     Home Equipment: None          Prior Functioning/Environment Prior Level of Function : Independent/Modified Independent;History of Falls (last six months)                    OT Problem List: Cardiopulmonary status limiting activity;Decreased activity tolerance;Impaired balance (sitting and/or standing)   OT Treatment/Interventions:        OT Goals(Current goals can be found in the care plan section)   Acute Rehab OT Goals Patient Stated Goal: go home OT Goal Formulation: All assessment and education complete, DC therapy   OT Frequency:       Co-evaluation              AM-PAC OT 6 Clicks Daily Activity     Outcome Measure Help from another person eating meals?: None Help from another person taking care of personal grooming?: None Help from another person toileting, which includes using toliet, bedpan, or urinal?: None Help from another person bathing (including washing, rinsing, drying)?: A Little Help from another person to put on and taking off regular upper body clothing?: None Help from another person to put on and taking off regular lower body clothing?: None 6 Click Score: 23   End of Session    Activity Tolerance: Patient tolerated treatment well Patient left: in chair;with call bell/phone within reach;with chair alarm set;with family/visitor present  OT Visit Diagnosis: Unsteadiness on feet (R26.81)                Time: 8675-8658 OT Time Calculation (min): 17 min Charges:  OT General Charges $OT Visit: 1 Visit OT Evaluation $OT Eval Low Complexity: 1 Low  Warren SAUNDERS., MPH, MS,  OTR/L ascom [phone] 02/15/2024, 2:17 PM

## 2024-02-15 NOTE — TOC Initial Note (Signed)
 Transition of Care [facility]) - Initial/Assessment Note    Patient Details  Name: Jared Berry MRN: [id number] Date of Birth: 05/19/1950  Transition of Care [facility]) CM/SW Contact:    Grayce JAYSON Perfect, RN Phone Number: 02/15/2024, 11:10 AM  Clinical Narrative:                  RNCM met with patient in his hospital room.  Patient uses cane in his home, OT/PT assessments pending.  Patient has PCP at [facility], drives, no difficulty obtaining his medications or paying for them.  Patient plans to return home at discharge.  He does have girlfriend who lives close by and checks in daily with him.  RNCM will continue to monitor discharge plan.  Order obtained for rolling walker and sent to [facility] representative.      Patient Goals and CMS Choice            Expected Discharge Plan and Services                                              Prior Living Arrangements/Services                       Activities of Daily Living   ADL Screening (condition at time of admission) Independently performs ADLs?: Yes (appropriate for developmental age) Is the patient deaf or have difficulty hearing?: No Does the patient have difficulty seeing, even when wearing glasses/contacts?: No Does the patient have difficulty concentrating, remembering, or making decisions?: No  Permission Sought/Granted                  Emotional Assessment              Admission diagnosis:  Weakness [R53.1] Stroke-like symptoms [R29.90] Patient Active Problem List   Diagnosis Date Noted   Stroke-like symptoms 02/14/2024   Abnormal results of function studies of other organs and systems 12/16/2020   Age-related nuclear cataract, bilateral 12/16/2020   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 12/16/2020   Bell's palsy 12/16/2020   Borderline glaucoma, 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 [place] [address] Phone: [phone] Fax: [phone]  [facility] - [place], [place] - [address] [place] [address] Phone: [phone] Fax: [phone]     Social Drivers of Health (SDOH) Social History: SDOH Screenings   Food Insecurity: Patient Declined (02/14/2024)  Housing: Patient Declined (02/14/2024)  Transportation Needs: Patient Declined (02/14/2024)  Utilities: Patient Declined (02/14/2024)  Social Connections: Patient Declined (02/14/2024)  Tobacco Use: High Risk (02/14/2024)   SDOH Interventions:     Readmission Risk Interventions     No data to display

## 2024-02-16 ENCOUNTER — Encounter: Payer: Self-pay | Admitting: Internal Medicine

## 2024-02-16 DIAGNOSIS — I25119 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: Secondary | ICD-10-CM

## 2024-02-16 DIAGNOSIS — R2990 Unspecified symptoms and signs involving the nervous system: Secondary | ICD-10-CM | POA: Diagnosis not present

## 2024-02-16 DIAGNOSIS — I479 Paroxysmal tachycardia, unspecified: Secondary | ICD-10-CM

## 2024-02-16 DIAGNOSIS — R531 Weakness: Secondary | ICD-10-CM | POA: Diagnosis not present

## 2024-02-16 DIAGNOSIS — R42 Dizziness and giddiness: Secondary | ICD-10-CM | POA: Diagnosis not present

## 2024-02-16 DIAGNOSIS — I4719 Other supraventricular tachycardia: Secondary | ICD-10-CM | POA: Diagnosis not present

## 2024-02-16 DIAGNOSIS — R000 Tachycardia, unspecified: Secondary | ICD-10-CM | POA: Diagnosis not present

## 2024-02-16 LAB — BASIC METABOLIC PANEL WITH GFR
Anion gap: 13 (ref 5–15)
BUN: 11 mg/dL (ref 8–23)
CO2: 24 mmol/L (ref 22–32)
Calcium: 8.7 mg/dL — ABNORMAL LOW (ref 8.9–10.3)
Chloride: 100 mmol/L (ref 98–111)
Creatinine, Ser: 1 mg/dL (ref 0.61–1.24)
GFR, Estimated: >60 mL/min
Glucose, Bld: 100 mg/dL — ABNORMAL HIGH (ref 70–99)
Potassium: 3.6 mmol/L (ref 3.5–5.1)
Sodium: 137 mmol/L (ref 135–145)

## 2024-02-16 LAB — PHOSPHORUS: Phosphorus: 3.3 mg/dL (ref 2.5–4.6)

## 2024-02-16 LAB — URINE CULTURE

## 2024-02-16 LAB — MAGNESIUM: Magnesium: 1.9 mg/dL (ref 1.7–2.4)

## 2024-02-16 MED ORDER — POTASSIUM CHLORIDE CRYS ER 20 MEQ PO TBCR
40.0000 meq | EXTENDED_RELEASE_TABLET | Freq: Once | ORAL | Status: AC
  Administered 2024-02-16: 40 meq via ORAL
  Filled 2024-02-16: qty 2

## 2024-02-16 MED ORDER — ROSUVASTATIN CALCIUM 10 MG PO TABS
40.0000 mg | ORAL_TABLET | Freq: Every day | ORAL | Status: DC
  Administered 2024-02-17: 40 mg via ORAL
  Filled 2024-02-16: qty 4

## 2024-02-16 MED ORDER — METOPROLOL TARTRATE 50 MG PO TABS
50.0000 mg | ORAL_TABLET | Freq: Two times a day (BID) | ORAL | Status: DC
  Administered 2024-02-16 (×2): 50 mg via ORAL
  Filled 2024-02-16 (×2): qty 1

## 2024-02-16 MED ORDER — DILTIAZEM HCL 30 MG PO TABS
30.0000 mg | ORAL_TABLET | Freq: Four times a day (QID) | ORAL | Status: DC
  Administered 2024-02-16: 30 mg via ORAL
  Filled 2024-02-16 (×2): qty 1

## 2024-02-16 MED ORDER — METOPROLOL TARTRATE 5 MG/5ML IV SOLN: 5.0000 mg | INTRAVENOUS | Status: DC | PRN

## 2024-02-16 NOTE — Progress Notes (Signed)
 [facility] Progress Note  Patient: Jared Berry    FMW:[id number]  DOA: 02/14/2024     Date of Service: the patient was seen and examined on 02/16/2024  Chief Complaint  Patient presents with   Code Stroke   Brief hospital course:  Jared Berry is a 74 y.o. year old male with medical history of hypertension, hyperlipidemia, prediabetes, tobacco use disorder presented to the ED with right leg weakness.  States this occurred after waking up this morning.  This was reported as an acute change; stroke was initiated.  CT head was without any acute findings.  Patient was outside of any acute intervention and his symptoms were not consistent with LVO.  Neurology evaluated the patient and recommended admission for further workup.  Lab work showed CBC being unremarkable.  CMP with mild hyperkalemia, creatinine at 1.31 with baseline unknown.  CK normal.  UA without signs of infection, ethanol level normal.  CT head without any acute changes but mild to moderate chronic small vessel ischemic disease.  Given need for further workup, [facility] contacted for admission.     Assessment and Plan:  # Right lower extremity weakness Acute CVA ruled out, MRI negative  Disc herniation at S1 Patient was evaluated by neurology who did not feel his symptoms were consistent with TIA.  MRI was negative for acute infarct.  Patient underwent MRI L spine: Which was notable for disc bulge at L5-S1, with mild right greater than left neuroforaminal narrowing.  Neurosurgery was consulted and did not feel that patient's symptoms were consistent with imaging findings.  Patient feels his weakness has resolved.  He will discharge home with home health physical therapy  #Narrow complex tachycardia Patient had elevated heart rate in the 160s 1/6 with narrow complex tachycardia.  This was self limiting. However, patient developed narrow complex tachycardia with Hrs in the 140s persistently. He was asymptomatic and HDS. He was  started on hep gtt and short acting diltiazem . Cardiology was consulted and noted patient not in atrial flutter or afib. Hep gtt was d/c'd and patient was started on PO metoprolol . His TTE showed nl LVEF.  -Continue metoprolol .   Tobacco use disorder: 2 packs/day.  Advised on smoking cessation.  Continue nicotine  replacement therapy while inpatient  Prediabetes: Monitor CBG daily.   CAD: High intensity statin as above.   Hypertension:  Will discontinue amlodipine , will need his antihypertensives adjusted in outpatient setting.  Hypophosphatemia, resolved Replace as needed  Urinary frequency,  Urinalysis with no significant white blood cells, making UTI less likely. - Follow-up bladder scan  Body mass index is 24.04 kg/m.  Interventions:   Diet: Heart healthy diet DVT Prophylaxis: Subcutaneous Lovenox    Advance goals of care discussion: Full code  Family Communication: family was not present at bedside, at the time of interview.    Disposition:  Pt is from Home,Discharge to home with home PT   Subjective: Patient had narrow complex tachycardia overnight, up to 160s.  This was self-limited.  He had no symptoms during this time.  This morning he denies chest pain, shortness of breath, or palpitations.  Physical Exam: Physical Exam  Constitutional: In no distress.  Cardiovascular: Normal rate, regular rhythm. No lower extremity edema  Pulmonary: Non labored breathing on room air, no wheezing or rales.   Abdominal: Soft. Non distended and non tender Musculoskeletal: TTP of muscle along interior of of R and L leg   Neurological: Alert and oriented to person, place, and time. Non focal  Skin: Skin is warm and dry.   Vitals:   02/15/24 2353 02/16/24 0350 02/16/24 0720 02/16/24 1129  BP: 126/78 132/74 120/85 118/78  Pulse: 74 72 81 68  Resp: 18 18  17   Temp: 98.1 F (36.7 C) 99.3 F (37.4 C) 98.6 F (37 C) 97.7 F (36.5 C)  TempSrc:   Oral Oral  SpO2: 95% 97% 98% 95%   Weight:      Height:        Intake/Output Summary (Last 24 hours) at 02/16/2024 1244 Last data filed at 02/15/2024 1500 Gross per 24 hour  Intake --  Output 200 ml  Net -200 ml   Filed Weights   02/14/24 1400 02/14/24 2005  Weight: 81.4 kg 78.2 kg    Data Reviewed: I have personally reviewed and interpreted daily labs, tele strips, imagings as discussed above. I reviewed all nursing notes, pharmacy notes, vitals, pertinent old records I have discussed plan of care as described above with RN and patient/family.  CBC: Recent Labs  Lab 02/14/24 1322  WBC 5.9  NEUTROABS 4.6  HGB 14.5  HCT 43.5  MCV 89.1  PLT 230   Basic Metabolic Panel: Recent Labs  Lab 02/14/24 1322 02/14/24 2319 02/15/24 0945 02/16/24 0354  NA 136  --  135 137  K 5.2* 3.7 4.0 3.6  CL 100  --  99 100  CO2 22  --  22 24  GLUCOSE 105*  --  130* 100*  BUN 14  --  11 11  CREATININE 1.31*  --  1.08 1.00  CALCIUM  9.0  --  9.6 8.7*  MG  --   --  2.0 1.9  PHOS  --   --  2.1* 3.3    Studies: No results found.   Scheduled Meds:  aspirin  EC  81 mg Oral Daily   enoxaparin  (LOVENOX ) injection  40 mg Subcutaneous QPM   metoprolol  tartrate  50 mg Oral BID   nicotine   14 mg Transdermal Daily   rosuvastatin   20 mg Oral Daily   sodium chloride  flush  3 mL Intravenous Once   Continuous Infusions: PRN Meds: metoprolol  tartrate  Time spent: 35 minutes  Author: Alban Pepper. MD [facility] 02/16/2024 12:44 PM  To reach On-call, see care teams to locate the attending and reach out to them via [email or web address]. If 7PM-7AM, please contact night-coverage If you still have difficulty reaching the attending provider, please page the [facility] (Director on Call) for [facility] on amion for assistance.

## 2024-02-16 NOTE — Discharge Instructions (Addendum)
 Please follow up with your pcp regarding your muscle tightness.   Please take your medications as prescribed.

## 2024-02-16 NOTE — Plan of Care (Signed)
 TTE with bubble study: 1. Left ventricular ejection fraction, by estimation, is 60 to 65%. The  left ventricle has normal function. The left ventricle has no regional  wall motion abnormalities. Left ventricular diastolic parameters are  consistent with Grade I diastolic dysfunction (impaired relaxation).   2. Right ventricular systolic function is normal. The right ventricular  size is normal.   3. The mitral valve is normal in structure. Trivial mitral valve  regurgitation.   4. The aortic valve is normal in structure. Aortic valve regurgitation is  not visualized.   5. Agitated saline contrast bubble study was negative, with no evidence  of any interatrial shunt.   CTA of head and neck: 1. Positive for Extensive distal aortic arch ulcerated soft plaque, without arch dissection (series 4 image 180). And similar anterior left CCA plaque ulceration vs small pseudoaneurysm (series 10 image 123), without dissection or stenosis. 2. Negative for large vessel occlusion , and no significant atherosclerosis or stenosis in the neck. 3. Intracranial atherosclerosis most pronounced in the left PCA p2 segment with mild to moderate irregularity and stenosis there.  MRI brain: No acute stroke  MRI L-spine: 1. Right eccentric disc bulge at L5-S1 contacts the descending right S1 nerve root, with mild right greater than left neural foraminal narrowing. 2. Incompletely evaluated right renal and hepatic T2 hyperintensities, which may represent cysts.  See prior notes for other components of the stroke imaging work up, which is now completed.   A/R:  74 y.o. male presenting with acute onset of RLE weakness.  - Agree with starting rosuvastatin .  - Continue ASA. - Not an operative candidate regarding his  No change to previously documented recommendations. Neuorhospitalist service will sign off. Please call if there are additional questions.   Electronically signed: Dr. Ayomide Purdy

## 2024-02-16 NOTE — Plan of Care (Signed)

## 2024-02-16 NOTE — TOC Progression Note (Signed)
 Transition of Care [facility]) - Progression Note    Patient Details  Name: Jared Berry MRN: [id number] Date of Birth: April 03, 1950  [facility]) CM/SW Contact  Grayce JAYSON Perfect, RN Phone Number: 02/16/2024, 2:31 PM  Clinical Narrative:     [facility]. Probable discharge today after cardiology consult completed.   Expected Discharge Plan: Home/Self Care Barriers to Discharge: No Barriers Identified               Expected Discharge Plan and Services       Living arrangements for the past 2 months: Single Family Home                 DME Arranged: Walker rolling DME Agency: [facility], [place] Date DME Agency Contacted: 02/15/24   Representative spoke with at DME Agency: Glyn Kerns             Social Drivers of Health (SDOH) Interventions SDOH Screenings   Food Insecurity: Patient Declined (02/14/2024)  Housing: Patient Declined (02/14/2024)  Transportation Needs: Patient Declined (02/14/2024)  Utilities: Patient Declined (02/14/2024)  Social Connections: Patient Declined (02/14/2024)  Tobacco Use: High Risk (02/14/2024)    Readmission Risk Interventions     No data to display

## 2024-02-16 NOTE — Consult Note (Signed)
 "  Cardiology Consultation:   Patient ID: Jared Berry; [id number]; 18-Sep-1950   Admit date: 02/14/2024 Date of Consult: 02/16/2024  Primary Care Provider: System, Provider Not In Primary Cardiologist: New - consult by [facility] Primary Electrophysiologist:  None   Patient Profile:   Jared Berry is a 74 y.o. male with a hx of HTN, HLD, prediabetes, and tobacco use who is being seen today for the evaluation of narrow complex tachycardia at the request of Dr. Franchot.  History of Present Illness:   Jared Berry reports an approximate 1 year history of intermittent palpitations with associated dizziness.  No prior cardiac evaluations.  He presented to [facility] on 1/5 of/2026 with right lower extremity weakness and paresthesias involving the right foot.  Code stroke was initiated.  CTh showed no acute intracranial findings with mild to moderate chronic small vessel disease.  MRI of the brain showed no evidence of acute abnormality with ventriculomegaly mildly out of proportion to sulcal enlargement possibly related to central predominant atrophy NPH.  CTA of the head and neck demonstrated extensive distal aortic arch ulcerated soft plaque without large dissection as well as similar anterior left CCA plaque ulceration versus small pseudoaneurysm without dissection or stenosis.  No evidence of large vessel occlusion or significant atherosclerosis/stenosis in the neck.  Intracranial atherosclerosis most pronounced in the left PCA P2 segment.  MRI of the lumbar spine showed a right eccentric disc bulge at L5-S1 contacting the descending right S1 nerve root mild right greater than left neuroforaminal narrowing.  Throughout admission, he has had brief paroxysms of atrial tachycardia into the 140s to 150s bpm that have spontaneously resolved.  Echo on 1/6 demonstrated an EF of 60 to 65%, no regional wall motion abnormalities, grade 1 diastolic dysfunction, normal RV systolic function and ventricular cavity size,  trivial mitral regurgitation, and negative bubble study.  On the morning of 1/7, he redeveloped narrow complex tachycardia into the 140s to 150s bpm.  Initially, heparin  drip was ordered out of concern for atrial flutter.  Upon cardiology review of EKG and telemetry, no apparent flutter waves identified with findings more consistent with atrial tachycardia leading to heparin  drip to be discontinued.  Patient did receive short acting diltiazem  30 mg this morning and currently remains in atrial tachycardia.  At time of cardiology consult, he is without symptoms of chest pain or dyspnea.  He reports an approximate 1 year history of intermittent palpitations with associated lightheadedness.  No near-syncope or syncope.      Past Medical History:  Diagnosis Date   HLD (hyperlipidemia)    HTN (hypertension)    Prediabetes     History reviewed. No pertinent surgical history.   Home Meds: Prior to Admission medications  Medication Sig Start Date End Date Taking? Authorizing Provider  Multiple Vitamin (MULTIVITAMIN) capsule Take 1 capsule by mouth daily.   Yes [provider]  nicotine  (NICODERM CQ  - DOSED IN MG/24 HR) 7 mg/24hr patch Place 7 mg onto the skin daily. 01/10/24  Yes [provider]  amLODipine -benazepril (LOTREL) 5-10 MG capsule Take 1 capsule by mouth daily. Patient not taking: Reported on 02/14/2024 04/15/23   [provider]    Inpatient Medications: Scheduled Meds:  aspirin  EC  81 mg Oral Daily   enoxaparin  (LOVENOX ) injection  40 mg Subcutaneous QPM   metoprolol  tartrate  50 mg Oral BID   nicotine   14 mg Transdermal Daily   rosuvastatin   20 mg Oral Daily   sodium chloride  flush  3  mL Intravenous Once   Continuous Infusions:  PRN Meds: metoprolol  tartrate  Allergies:  Allergies[1]  Social History:   Social History   Socioeconomic History   Marital status: Married    Spouse name: Not on file   Number of children: Not on file   Years of  education: Not on file   Highest education level: Not on file  Occupational History   Not on file  Tobacco Use   Smoking status: Every Day    Current packs/day: 2.00    Types: Cigarettes   Smokeless tobacco: Never  Substance and Sexual Activity   Alcohol use: No   Drug use: No   Sexual activity: Not on file  Other Topics Concern   Not on file  Social History Narrative   Not on file   Social Drivers of Health   Tobacco Use: High Risk (02/14/2024)   Patient History    Smoking Tobacco Use: Every Day    Smokeless Tobacco Use: Never    Passive Exposure: Not on file  Financial Resource Strain: Not on file  Food Insecurity: Patient Declined (02/14/2024)   Epic    Worried About Programme Researcher, Broadcasting/film/video in the Last Year: Patient declined    Barista in the Last Year: Patient declined  Transportation Needs: Patient Declined (02/14/2024)   Epic    Lack of Transportation (Medical): Patient declined    Lack of Transportation (Non-Medical): Patient declined  Physical Activity: Not on file  Stress: Not on file  Social Connections: Patient Declined (02/14/2024)   Social Connection and Isolation Panel    Frequency of Communication with Friends and Family: Patient declined    Frequency of Social Gatherings with Friends and Family: Patient declined    Attends Religious Services: Patient declined    Active Member of Clubs or Organizations: Patient declined    Attends Banker Meetings: Patient declined    Marital Status: Patient declined  Intimate Partner Violence: Patient Declined (02/14/2024)   Epic    Fear of Current or Ex-Partner: Patient declined    Emotionally Abused: Patient declined    Physically Abused: Patient declined    Sexually Abused: Patient declined  Depression (PHQ2-9): Not on file  Alcohol Screen: Not on file  Housing: Patient Declined (02/14/2024)   Epic    Unable to Pay for Housing in the Last Year: Patient declined    Number of Times Moved in the Last Year:  Not on file    Homeless in the Last Year: Patient declined  Utilities: Patient Declined (02/14/2024)   Epic    Threatened with loss of utilities: Patient declined  Health Literacy: Not on file     Family History:   Family History  Problem Relation Age of Onset   Hypertension Father     ROS:  Review of Systems  Constitutional:  Negative for chills, diaphoresis, fever, malaise/fatigue and weight loss.  HENT:  Negative for congestion.   Eyes:  Negative for discharge and redness.  Respiratory:  Negative for cough, sputum production, shortness of breath and wheezing.   Cardiovascular:  Positive for palpitations. Negative for chest pain, orthopnea, claudication, leg swelling and PND.  Gastrointestinal:  Negative for abdominal pain, heartburn, nausea and vomiting.  Musculoskeletal:  Negative for falls and myalgias.  Skin:  Negative for rash.  Neurological:  Positive for sensory change. Negative for dizziness, tingling, tremors, speech change, focal weakness, loss of consciousness and weakness.  Endo/Heme/Allergies:  Does not bruise/bleed easily.  Psychiatric/Behavioral:  Negative for substance abuse. The patient is not nervous/anxious.   All other systems reviewed and are negative.     Physical Exam/Data:   Vitals:   02/15/24 2000 02/15/24 2353 02/16/24 0350 02/16/24 0720  BP: 123/77 126/78 132/74 120/85  Pulse: 77 74 72 81  Resp: 18 18 18    Temp: 99.3 F (37.4 C) 98.1 F (36.7 C) 99.3 F (37.4 C) 98.6 F (37 C)  TempSrc:    Oral  SpO2: 97% 95% 97% 98%  Weight:      Height:        Intake/Output Summary (Last 24 hours) at 02/16/2024 1145 Last data filed at 02/15/2024 1500 Gross per 24 hour  Intake --  Output 200 ml  Net -200 ml   Filed Weights   02/14/24 1400 02/14/24 2005  Weight: 81.4 kg 78.2 kg   Body mass index is 24.04 kg/m.   Physical Exam: General: Well developed, well nourished, in no acute distress. Head: Normocephalic, atraumatic, sclera non-icteric, no  xanthomas, nares without discharge.  Neck: Negative for carotid bruits. JVD not elevated. Lungs: Clear bilaterally to auscultation without wheezes, rales, or rhonchi. Breathing is unlabored. Heart: Tachycardic with S1 S2. No murmurs, rubs, or gallops appreciated. Abdomen: Soft, non-tender, non-distended with normoactive bowel sounds. No hepatomegaly. No rebound/guarding. No obvious abdominal masses. Msk:  Strength and tone appear normal for age. Extremities: No clubbing or cyanosis. No edema. Distal pedal pulses are 2+ and equal bilaterally. Neuro: Alert and oriented X 3. No facial asymmetry. No focal deficit. Moves all extremities spontaneously. Psych:  Responds to questions appropriately with a normal affect.   EKG:  The EKG was personally reviewed and demonstrates: 1/5 - NSR, 85 bpm, rare PVC, baseline wandering, poor R wave progression along the precordial leads, possible prior anterior infarct versus lead placement.  1/7 - atrial tachycardia, 144 bpm Telemetry:  Telemetry was personally reviewed and demonstrates: SR with paroxysms of atrial tachycardia into the 140s to 150s bpm, rare PVCs, currently in atrial tachycardia in the 140s bpm  Weights: Filed Weights   02/14/24 1400 02/14/24 2005  Weight: 81.4 kg 78.2 kg    Relevant CV Studies:  2D echo 02/15/2024: 1. Left ventricular ejection fraction, by estimation, is 60 to 65%. The  left ventricle has normal function. The left ventricle has no regional  wall motion abnormalities. Left ventricular diastolic parameters are  consistent with Grade I diastolic  dysfunction (impaired relaxation).   2. Right ventricular systolic function is normal. The right ventricular  size is normal.   3. The mitral valve is normal in structure. Trivial mitral valve  regurgitation.   4. The aortic valve is normal in structure. Aortic valve regurgitation is  not visualized.   5. Agitated saline contrast bubble study was negative, with no evidence  of  any interatrial shunt.   Laboratory Data:  Chemistry Recent Labs  Lab 02/14/24 1322 02/14/24 2319 02/15/24 0945 02/16/24 0354  NA 136  --  135 137  K 5.2* 3.7 4.0 3.6  CL 100  --  99 100  CO2 22  --  22 24  GLUCOSE 105*  --  130* 100*  BUN 14  --  11 11  CREATININE 1.31*  --  1.08 1.00  CALCIUM  9.0  --  9.6 8.7*  GFRNONAA 58*  --  >60 >60  ANIONGAP 14  --  14 13    Recent Labs  Lab 02/14/24 1322  PROT 7.2  ALBUMIN 3.6  AST 30  ALT  8  ALKPHOS 77  BILITOT 0.6   Hematology Recent Labs  Lab 02/14/24 1322  WBC 5.9  RBC 4.88  HGB 14.5  HCT 43.5  MCV 89.1  MCH 29.7  MCHC 33.3  RDW 15.9*  PLT 230   Cardiac EnzymesNo results for input(s): TROPONINI in the last 168 hours. No results for input(s): TROPIPOC in the last 168 hours.  BNPNo results for input(s): BNP, PROBNP in the last 168 hours.  DDimer No results for input(s): DDIMER in the last 168 hours.  Radiology/Studies:   MR LUMBAR SPINE WO CONTRAST Result Date: 02/15/2024 IMPRESSION: 1. Right eccentric disc bulge at L5-S1 contacts the descending right S1 nerve root, with mild right greater than left neural foraminal narrowing. 2. Incompletely evaluated right renal and hepatic T2 hyperintensities, which may represent cysts. Electronically signed by: Prentice Spade MD 02/15/2024 12:31 PM EST RP Workstation: [id number]   CT ANGIO HEAD NECK W WO CM Result Date: 02/15/2024 IMPRESSION: 1. Positive for Extensive distal aortic arch ulcerated soft plaque, without arch dissection (series 4 image 180). And similar anterior left CCA plaque ulceration vs small pseudoaneurysm (series 10 image 123), without dissection or stenosis. 2. Negative for large vessel occlusion , and no significant atherosclerosis or stenosis in the neck. 3. Intracranial atherosclerosis most pronounced in the left PCA p2 segment with mild to moderate irregularity and stenosis there. Electronically signed by: Helayne Hurst MD 02/15/2024 05:53 AM EST RP  Workstation: [id number]   MR BRAIN WO CONTRAST Result Date: 02/14/2024 IMPRESSION: 1. No evidence of acute abnormality. 2. Ventriculomegaly, which is mildly out of proportion to sulcal enlargement. This could be due to central predominant atrophy or normal pressure hydrocephalus. 3. Small area of superficial siderosis along the right parietal convexity. Electronically signed by: Gilmore Molt 02/14/2024 09:46 PM EST RP Workstation: [id number]   CT HEAD CODE STROKE WO CONTRAST Result Date: 02/14/2024 IMPRESSION: 1. No acute intracranial abnormality. ASPECTS of 10. 2. Mild to moderate chronic small vessel ischemic disease. 3. These results were communicated to Dr. CHARLENA Shark at 1:43 PM on 02/14/2024 by secure text page via the [facility] messaging system. Electronically signed by: Dasie Hamburg MD 02/14/2024 01:43 PM EST RP Workstation: [id number]    Assessment and Plan:   1. Paroxysmal atrial tachycardia: - No evidence of atrial flutter waves on EKG or telemetry  - Reports an approximate 1 year history of intermittent palpitations with associated lightheadedness  - Start Lopressor  50 mg bid - Stop short-acting diltiazem   - As needed IV Lopressor  5 mg - No indication for heparin  gtt at this time - Hemodynamically stable - TSH, potassium, magnesium normal - Echo with preserved LV systolic function  2.  Aortic atherosclerosis/HLD: - Imaging this admission notable for extensive distal aortic arch ulcerated soft plaque without dissection as well as a similar anterior left CCA plaque ulceration versus small pseudoaneurysm - LDL 91 this admission, target less than 70 - Now on aspirin  81 mg and rosuvastatin  20 mg  3.  Right lower extremity weakness/pedal paresthesia: - Evaluated by neurosurgery with MRI findings not felt to be etiology of weakness leading to admission  - Echo with negative bubble study - EKG and telemetry not consistent with atrial flutter - Ongoing evaluation and management per  internal medicine and neurology       For questions or updates, please contact [facility] Please consult [email or web address] for contact info under Cardiology/STEMI.   Signed, Bernardino Bring, PA-C [facility] Pager: [phone] 02/16/2024, 11:45 AM       [  1] No Known Allergies  "

## 2024-02-16 NOTE — Hospital Course (Addendum)
 Physical Exam  Constitutional: In no distress.  Cardiovascular: Normal rate, regular rhythm. No lower extremity edema  Pulmonary: Non labored breathing on room air, no wheezing or rales.   Abdominal: Soft. Non distended and non tender Musculoskeletal: TTP of muscle along interior of of R and L leg   Neurological: Alert and oriented to person, place, and time. Non focal  Skin: Skin is warm and dry.    Some pvcs some episosde of tachycardia. On tlemetry

## 2024-02-17 ENCOUNTER — Other Ambulatory Visit: Payer: Self-pay

## 2024-02-17 DIAGNOSIS — I479 Paroxysmal tachycardia, unspecified: Secondary | ICD-10-CM | POA: Diagnosis not present

## 2024-02-17 DIAGNOSIS — R531 Weakness: Secondary | ICD-10-CM | POA: Diagnosis not present

## 2024-02-17 DIAGNOSIS — I25119 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: Secondary | ICD-10-CM | POA: Diagnosis not present

## 2024-02-17 DIAGNOSIS — R2990 Unspecified symptoms and signs involving the nervous system: Secondary | ICD-10-CM | POA: Diagnosis not present

## 2024-02-17 LAB — CBC
HCT: 38.6 % — ABNORMAL LOW (ref 39.0–52.0)
Hemoglobin: 13.2 g/dL (ref 13.0–17.0)
MCH: 29.6 pg (ref 26.0–34.0)
MCHC: 34.2 g/dL (ref 30.0–36.0)
MCV: 86.5 fL (ref 80.0–100.0)
Platelets: 216 K/uL (ref 150–400)
RBC: 4.46 MIL/uL (ref 4.22–5.81)
RDW: 14.6 % (ref 11.5–15.5)
WBC: 4.4 K/uL (ref 4.0–10.5)
nRBC: 0 % (ref 0.0–0.2)

## 2024-02-17 MED ORDER — METOPROLOL TARTRATE 50 MG PO TABS
50.0000 mg | ORAL_TABLET | Freq: Two times a day (BID) | ORAL | Status: DC
  Administered 2024-02-17: 50 mg via ORAL
  Filled 2024-02-17: qty 1

## 2024-02-17 MED ORDER — ASPIRIN 81 MG PO TBEC
81.0000 mg | DELAYED_RELEASE_TABLET | Freq: Every day | ORAL | 12 refills | Status: AC
  Filled 2024-02-17: qty 30, 30d supply, fill #0

## 2024-02-17 MED ORDER — ROSUVASTATIN CALCIUM 40 MG PO TABS
40.0000 mg | ORAL_TABLET | Freq: Every day | ORAL | 0 refills | Status: AC
  Filled 2024-02-17: qty 30, 30d supply, fill #0

## 2024-02-17 MED ORDER — METOPROLOL TARTRATE 25 MG PO TABS: 25.0000 mg | ORAL_TABLET | Freq: Two times a day (BID) | ORAL | Status: DC

## 2024-02-17 MED ORDER — METOPROLOL TARTRATE 50 MG PO TABS
50.0000 mg | ORAL_TABLET | Freq: Two times a day (BID) | ORAL | 0 refills | Status: AC
  Filled 2024-02-17: qty 60, 30d supply, fill #0

## 2024-02-17 NOTE — Progress Notes (Signed)
 Mobility Specialist - Progress Note   02/17/24 0900  Mobility  Activity Ambulated with assistance;Stood at bedside;Dangled on edge of bed  Level of Assistance Standby assist, set-up cues, supervision of patient - no hands on  Assistive Device Front wheel walker  Distance Ambulated (ft) 200 ft  Range of Motion/Exercises Active  Activity Response Tolerated well  Mobility visit 1 Mobility  Mobility Specialist Start Time (ACUTE ONLY) [id number]  Mobility Specialist Stop Time (ACUTE ONLY) [id number]  Mobility Specialist Time Calculation (min) (ACUTE ONLY) 19 min   Pt was supine in bed with the HOB elevated on RA upon entry. Pt agreed to mobility. Pt did state groin pain that was present. Pt is able today to get to the EOB independently with bed features. Pt is able today to STS with 2 WW. Pt ambulated well. Pt did utilize recovery break to check vitals. Pt vitals were WNL. After activity pt returned to the room back in bed with needs in reach and bed alarm on. Breakfast was delivered upon exit.  Clem Rodes Mobility Specialist 02/17/2024, 9:23 AM

## 2024-02-17 NOTE — Plan of Care (Signed)
" °  Problem: Education: Goal: Knowledge of disease or condition will improve Outcome: Adequate for Discharge Goal: Knowledge of secondary prevention will improve (MUST DOCUMENT ALL) Outcome: Adequate for Discharge Goal: Knowledge of patient specific risk factors will improve (DELETE if not current risk factor) Outcome: Adequate for Discharge   Problem: Ischemic Stroke/TIA Tissue Perfusion: Goal: Complications of ischemic stroke/TIA will be minimized Outcome: Adequate for Discharge   Problem: Coping: Goal: Will verbalize positive feelings about self Outcome: Adequate for Discharge   Problem: Coping: Goal: Will verbalize positive feelings about self Outcome: Adequate for Discharge Goal: Will identify appropriate support needs Outcome: Adequate for Discharge   Problem: Health Behavior/Discharge Planning: Goal: Ability to manage health-related needs will improve Outcome: Adequate for Discharge Goal: Goals will be collaboratively established with patient/family Outcome: Adequate for Discharge   Problem: Self-Care: Goal: Ability to participate in self-care as condition permits will improve Outcome: Adequate for Discharge Goal: Verbalization of feelings and concerns over difficulty with self-care will improve Outcome: Adequate for Discharge Goal: Ability to communicate needs accurately will improve Outcome: Adequate for Discharge   Problem: Nutrition: Goal: Risk of aspiration will decrease Outcome: Adequate for Discharge Goal: Dietary intake will improve Outcome: Adequate for Discharge   Problem: Education: Goal: Knowledge of General Education information will improve Description: Including pain rating scale, medication(s)/side effects and non-pharmacologic comfort measures Outcome: Adequate for Discharge   Problem: Health Behavior/Discharge Planning: Goal: Ability to manage health-related needs will improve Outcome: Adequate for Discharge   Problem: Clinical  Measurements: Goal: Ability to maintain clinical measurements within normal limits will improve Outcome: Adequate for Discharge Goal: Will remain free from infection Outcome: Adequate for Discharge Goal: Diagnostic test results will improve Outcome: Adequate for Discharge Goal: Respiratory complications will improve Outcome: Adequate for Discharge Goal: Cardiovascular complication will be avoided Outcome: Adequate for Discharge   Problem: Activity: Goal: Risk for activity intolerance will decrease Outcome: Adequate for Discharge   Problem: Nutrition: Goal: Adequate nutrition will be maintained Outcome: Adequate for Discharge   Problem: Coping: Goal: Level of anxiety will decrease Outcome: Adequate for Discharge   Problem: Elimination: Goal: Will not experience complications related to bowel motility Outcome: Adequate for Discharge Goal: Will not experience complications related to urinary retention Outcome: Adequate for Discharge   Problem: Pain Managment: Goal: General experience of comfort will improve and/or be controlled Outcome: Adequate for Discharge   Problem: Safety: Goal: Ability to remain free from injury will improve Outcome: Adequate for Discharge   Problem: Skin Integrity: Goal: Risk for impaired skin integrity will decrease Outcome: Adequate for Discharge   "

## 2024-02-17 NOTE — TOC Transition Note (Signed)
 Transition of Care [facility]) - Discharge Note   Patient Details  Name: Jared Berry MRN: [id number] Date of Birth: 1950/06/20  [facility] CM/SW Contact:  Lauraine JAYSON Carpen, LCSW Phone Number: 02/17/2024, 12:06 PM   Clinical Narrative:  Patient has orders to discharge home today. No further concerns. CSW signing off.   Final next level of care: Home/Self Care Barriers to Discharge: Barriers Resolved   Patient Goals and CMS Choice            Discharge Placement                    Patient and family notified of of transfer: 02/17/24  Discharge Plan and Services Additional resources added to the After Visit Summary for                  DME Arranged: Walker rolling DME Agency: [facility], [place] Date DME Agency Contacted: 02/15/24   Representative spoke with at DME Agency: Glyn Kerns            Social Drivers of Health (SDOH) Interventions SDOH Screenings   Food Insecurity: Patient Declined (02/14/2024)  Housing: Patient Declined (02/14/2024)  Transportation Needs: Patient Declined (02/14/2024)  Utilities: Patient Declined (02/14/2024)  Social Connections: Patient Declined (02/14/2024)  Tobacco Use: High Risk (02/14/2024)     Readmission Risk Interventions     No data to display

## 2024-02-17 NOTE — Progress Notes (Signed)
 Jared Berry to be discharged Home per MD order. Discussed prescriptions and follow up appointments with the patient. Prescriptions given to patient; medication list explained in detail. Patient verbalized understanding.   Skin clean, dry and intact without evidence of skin break down, no evidence of skin tears noted. IV catheter discontinued intact. Site without signs and symptoms of complications. Dressing and pressure applied. Pt denies pain at the site currently. No complaints noted.   Patient free of lines, drains, and wounds.   An After Visit Summary (AVS) was printed and given to the patient.  [name], Cena Helling, RN

## 2024-02-17 NOTE — Progress Notes (Signed)
 "  Rounding Note   Patient Name: Jared Berry Date of Encounter: 02/17/2024  [facility] Cardiologist: New to [facility]  Subjective Sitting up in bed eating breakfast, feels he is at his baseline Indicates he would like to go home Denies any episodes of dizziness concerning for arrhythmia Telemetry reviewed, maintaining normal sinus rhythm on metoprolol  tartrate Bradycardia overnight, heart rates up to 70 this morning  Scheduled Meds:  aspirin  EC  81 mg Oral Daily   enoxaparin  (LOVENOX ) injection  40 mg Subcutaneous QPM   metoprolol  tartrate  50 mg Oral BID   nicotine   14 mg Transdermal Daily   rosuvastatin   40 mg Oral Daily   sodium chloride  flush  3 mL Intravenous Once   Continuous Infusions:  PRN Meds: metoprolol  tartrate   Vital Signs  Vitals:   02/17/24 0400 02/17/24 0500 02/17/24 0600 02/17/24 0800  BP: 113/68  119/85 128/78  Pulse:    (!) 56  Resp: 20  16 17   Temp: 98 F (36.7 C)   98.4 F (36.9 C)  TempSrc:    Oral  SpO2: 93%   96%  Weight:  83.1 kg    Height:        Intake/Output Summary (Last 24 hours) at 02/17/2024 0949 Last data filed at 02/16/2024 1733 Gross per 24 hour  Intake --  Output 150 ml  Net -150 ml      02/17/2024    5:00 AM 02/14/2024    8:05 PM 02/14/2024    2:00 PM  Last 3 Weights  Weight (lbs) 183 lb 3.2 oz 172 lb 6.4 oz 179 lb 7.3 oz  Weight (kg) 83.1 kg 78.2 kg 81.4 kg      Telemetry Normal sinus rhythm- Personally Reviewed  ECG   - Personally Reviewed  Physical Exam  GEN: No acute distress.   Neck: No JVD Cardiac: RRR, no murmurs, rubs, or gallops.  Respiratory: Clear to auscultation bilaterally. GI: Soft, nontender, non-distended  MS: No edema; No deformity. Neuro:  Nonfocal  Psych: Normal affect   Labs High Sensitivity Troponin:  No results for input(s): TROPONINIHS in the last 720 hours. No results for input(s): TRNPT in the last 720 hours.     Chemistry Recent Labs  Lab 02/14/24 1322  02/14/24 2319 02/15/24 0945 02/16/24 0354  NA 136  --  135 137  K 5.2* 3.7 4.0 3.6  CL 100  --  99 100  CO2 22  --  22 24  GLUCOSE 105*  --  130* 100*  BUN 14  --  11 11  CREATININE 1.31*  --  1.08 1.00  CALCIUM  9.0  --  9.6 8.7*  MG  --   --  2.0 1.9  PROT 7.2  --   --   --   ALBUMIN 3.6  --   --   --   AST 30  --   --   --   ALT 8  --   --   --   ALKPHOS 77  --   --   --   BILITOT 0.6  --   --   --   GFRNONAA 58*  --  >60 >60  ANIONGAP 14  --  14 13    Lipids  Recent Labs  Lab 02/15/24 0508  CHOL 141  TRIG 82  HDL 33*  LDLCALC 91  CHOLHDL 4.2    Hematology Recent Labs  Lab 02/14/24 1322 02/17/24 0412  WBC 5.9 4.4  RBC 4.88 4.46  HGB 14.5 13.2  HCT 43.5 38.6*  MCV 89.1 86.5  MCH 29.7 29.6  MCHC 33.3 34.2  RDW 15.9* 14.6  PLT 230 216   Thyroid   Recent Labs  Lab 02/15/24 0945  TSH 2.180    BNPNo results for input(s): BNP, PROBNP in the last 168 hours.  DDimer No results for input(s): DDIMER in the last 168 hours.   Radiology  ECHOCARDIOGRAM COMPLETE BUBBLE STUDY Result Date: 02/15/2024    ECHOCARDIOGRAM REPORT   Patient Name:   Jared Berry Date of Exam: 02/15/2024 Medical Rec #:  [id number]       Height:       71.0 in Accession #:    [id number]      Weight:       172.4 lb Date of Birth:  1950/06/06       BSA:          1.980 m Patient Age:    73 years        BP:           143/79 mmHg Patient Gender: M               HR:           71 bpm. Exam Location:  [facility] Procedure: 2D Echo, Cardiac Doppler, Color Doppler and Saline Contrast Bubble            Study (Both Spectral and Color Flow Doppler were utilized during            procedure).                                 MODIFIED REPORT:   This report was modified by Jared JONETTA Lovelace MD on 02/15/2024 due to Negative                                  bubble study.  Indications:     Stroke 434.91 / I63.9  History:         Patient has no prior history of Echocardiogram examinations. No                  medical  histories on file.  Sonographer:     Jared Berry Referring Phys:  [id number] [facility] Diagnosing Phys: Jared JONETTA Lovelace MD  Sonographer Comments: Technically challenging study due to limited acoustic windows and no subcostal window. IMPRESSIONS  1. Left ventricular ejection fraction, by estimation, is 60 to 65%. The left ventricle has normal function. The left ventricle has no regional wall motion abnormalities. Left ventricular diastolic parameters are consistent with Grade I diastolic dysfunction (impaired relaxation).  2. Right ventricular systolic function is normal. The right ventricular size is normal.  3. The mitral valve is normal in structure. Trivial mitral valve regurgitation.  4. The aortic valve is normal in structure. Aortic valve regurgitation is not visualized.  5. Agitated saline contrast bubble study was negative, with no evidence of any interatrial shunt. FINDINGS  Left Ventricle: Left ventricular ejection fraction, by estimation, is 60 to 65%. The left ventricle has normal function. The left ventricle has no regional wall motion abnormalities. Strain was performed and the global longitudinal strain is indeterminate. The left ventricular internal cavity size was normal in size. There is no left ventricular hypertrophy. Left ventricular diastolic parameters are consistent with Grade  I diastolic dysfunction (impaired relaxation). Right Ventricle: The right ventricular size is normal. No increase in right ventricular wall thickness. Right ventricular systolic function is normal. Left Atrium: Left atrial size was normal in size. Right Atrium: Right atrial size was normal in size. Pericardium: There is no evidence of pericardial effusion. Mitral Valve: The mitral valve is normal in structure. Trivial mitral valve regurgitation. Tricuspid Valve: The tricuspid valve is normal in structure. Tricuspid valve regurgitation is trivial. Aortic Valve: The aortic valve is normal in structure. Aortic valve  regurgitation is not visualized. Aortic valve mean gradient measures 2.0 mmHg. Aortic valve peak gradient measures 4.1 mmHg. Aortic valve area, by VTI measures 3.14 cm. Pulmonic Valve: The pulmonic valve was normal in structure. Pulmonic valve regurgitation is not visualized. Aorta: The ascending aorta was not well visualized. IAS/Shunts: No atrial level shunt detected by color flow Doppler. Agitated saline contrast was given intravenously to evaluate for intracardiac shunting. Agitated saline contrast bubble study was negative, with no evidence of any interatrial shunt. Additional Comments: 3D was performed not requiring image post processing on an independent workstation and was indeterminate.  LEFT VENTRICLE PLAX 2D LVIDd:         4.00 cm   Diastology LVIDs:         2.60 cm   LV e' medial:    11.40 cm/s LV PW:         1.10 cm   LV E/e' medial:  8.3 LV IVS:        1.20 cm   LV e' lateral:   19.60 cm/s LVOT diam:     2.30 cm   LV E/e' lateral: 4.8 LV SV:         33 LV SV Index:   17 LVOT Area:     4.15 cm  RIGHT VENTRICLE RV Basal diam:  2.90 cm RV Mid diam:    2.50 cm RV S prime:     11.60 cm/s TAPSE (M-mode): 2.5 cm LEFT ATRIUM             Index        RIGHT ATRIUM           Index LA Vol (A2C):   22.6 ml 11.42 ml/m  RA Area:     18.90 cm LA Vol (A4C):   16.0 ml 8.08 ml/m   RA Volume:   52.80 ml  26.67 ml/m LA Biplane Vol: 19.7 ml 9.95 ml/m  AORTIC VALVE AV Area (Vmax):    3.28 cm AV Area (Vmean):   2.93 cm AV Area (VTI):     3.14 cm AV Vmax:           101.00 cm/s AV Vmean:          70.600 cm/s AV VTI:            0.104 m AV Peak Grad:      4.1 mmHg AV Mean Grad:      2.0 mmHg LVOT Vmax:         79.70 cm/s LVOT Vmean:        49.800 cm/s LVOT VTI:          0.079 m LVOT/AV VTI ratio: 0.76  AORTA Ao Root diam: 4.20 cm MITRAL VALVE                TRICUSPID VALVE MV Area (PHT): 7.99 cm     TR Peak grad:   28.1 mmHg MV Decel Time: 95 msec  TR Vmax:        265.00 cm/s MV E velocity: 94.90 cm/s MV A  velocity: 122.00 cm/s  SHUNTS MV E/A ratio:  0.78         Systemic VTI:  0.08 m                             Systemic Diam: 2.30 cm Jared JONETTA Lovelace MD Electronically signed by Jared JONETTA Lovelace MD Signature Date/Time: 02/15/2024/12:38:41 PM    Final (Updated)    MR LUMBAR SPINE WO CONTRAST Result Date: 02/15/2024 EXAM: MRI LUMBAR SPINE 02/15/2024 11:06:36 AM TECHNIQUE: Multiplanar multisequence MRI of the lumbar spine was performed without the administration of intravenous contrast. COMPARISON: None available. CLINICAL HISTORY: Lumbar radiculopathy, no red flags, no prior management. FINDINGS: BONES AND ALIGNMENT: There is straightening of the normal lumbar lordosis without significant vertebral column listhesis. There is mild intervertebral disc height and signal loss most pronounced at L5-S1. Normal vertebral body heights. Bone marrow signal is unremarkable. SPINAL CORD: The conus medullaris terminates at the L1-L2 level. SOFT TISSUES: No paraspinal mass. L1-L2: No significant disc herniation. No spinal canal stenosis or neural foraminal narrowing. Mild bilateral facet arthropathy. L2-L3: There is minimal disc bulge and mild bilateral facet arthropathy without significant spinal canal or neural foraminal stenosis. L3-L4: There is minimal disc bulge and mild bilateral facet arthropathy without significant spinal stenosis. There is mild bilateral neural foraminal narrowing. L4-L5: There is disc bulge slightly eccentric to the right and mild bilateral facet arthropathy without significant spinal stenosis. There is mild bilateral neural foraminal narrowing. L5-S1: There is disc bulge eccentric to the right with contact of the descending right S1 nerve roots. Mild bilateral facet arthropathy. No significant spinal stenosis. There is mild right greater than left bilateral neural foraminal narrowing. KIDNEYS: Large right renal T2 hyperintensity may represent a cyst. LIVER: Partially evaluated the right hepatic T2  hyperintensity which may represent a cyst. IMPRESSION: 1. Right eccentric disc bulge at L5-S1 contacts the descending right S1 nerve root, with mild right greater than left neural foraminal narrowing. 2. Incompletely evaluated right renal and hepatic T2 hyperintensities, which may represent cysts. Electronically signed by: Jared Spade MD 02/15/2024 12:31 PM EST RP Workstation: [id number]    Cardiac Studies   Patient Profile    Mr. Jared Berry is a 74 year old gentleman with history of smoking, hypertension, hyperlipidemia, prediabetes presenting to the hospital February 14, 2024 with weakness right leg, evaluated for code stroke On telemetry this admission with paroxysmal narrow complex tachycardia rate 140 bpm consistent with atrial tachycardia  Assessment & Plan  Paroxysmal atrial tachycardia Reports episodes of dizziness associated with palpitations dating back 1 year --Yesterday received Lopressor  push and started on metoprolol   tartrate 50 twice daily -Has maintained normal sinus rhythm since yesterday morning Echo with normal ejection fraction No indication for anticoagulation -Despite bradycardia overnight, heart rate up to 70s this morning sitting up eating breakfast, should be able to tolerate tartrate 50 twice daily   Aortic atherosclerosis Extensive aortic arch and carotid atherosclerosis Continue aspirin , Crestor  up to 40 daily -Goal LDL less than 55    lower extremity weakness/pedal neuropathy Echo with negative bubble study CT and MRI findings ruling out stroke MRI lumbar spine with eccentric disc bulge L5-S1 He reports focal numbness around his 1st and 2nd toe -Followed by neurology, aspirin , Crestor   [facility] will sign off.   Medication Recommendations: Continue medications as above Other recommendations (  labs, testing, etc): No further testing at this time Follow up as an outpatient: Outpatient follow-up in cardiology clinic, message has been sent  to scheduler   For questions or updates, please contact [facility] Please consult [email or web address] for contact info under     Signed, Cora Stetson, MD  02/17/2024, 9:49 AM    "

## 2024-02-17 NOTE — Plan of Care (Signed)

## 2024-02-18 NOTE — Discharge Summary (Signed)
 " Physician Discharge Summary   Patient: Jared Berry MRN: [id number] DOB: February 23, 1950  Admit date:     02/14/2024  Discharge date: 02/17/2024  Discharge Physician: Alban Pepper   PCP: System, Provider Not In   Recommendations at discharge:    Patient will need antihypertensives titrated outpatient.   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 *  Hospital Course: Jared Berry is a 74 y.o. year old male with medical history of hypertension, hyperlipidemia, prediabetes, tobacco use disorder presented to the ED with right leg weakness. States this occurred after waking up this morning. This was reported as an acute change; stroke was initiated. CT head was without any acute findings. Patient was outside of any acute intervention and his symptoms were not consistent with LVO. Neurology evaluated the patient and recommended admission for further workup. Lab work showed CBC being unremarkable. CMP with mild hyperkalemia, creatinine at 1.31 with baseline unknown. CK normal. UA without signs of infection, ethanol level normal. CT head without any acute changes but mild to moderate chronic small vessel ischemic disease. Given need for further workup, [facility] contacted for admission.   Assessment and Plan: Acute RLE weakness  MRI brain negative for stroke. Noted to have disc herniation at S1 on MRI of L spine, neurosurgery was consulted and felt his symptoms were not consistent with the imaging findings.  His symptoms resolved by the time of discharge.  He did note that he had some muscle tightness of the muscles of the inner thighs.  Discussed that he should stretch and use heat to help relax these.  Patient will also  continue aspirin  and statin.  Narrow complex tachycardia  Patient had episode 1/6 with heart rates in the 160s.  1/7 he developed tachycardia to the 140s persistently.  EKG notable for ectopic atrial tachycardia.  Patient reported that he did have some palpitations.  Patient was started on metoprolol  and his heart rate improved.  TTE ED was notable for normal LVEF. Patient will discharge with metoprolol  and have follow up with cardiology.   Tobacco use disorder Cessation was encouraged   Prediabetes  Will need to continue to monitor outpatient  HTN His home antihypertensives of amlodipine  and benazepril were paused given his low normal blood pressures.  He will need his antihypertensives titrated in outpatient setting.  CAD  On statin and  ASA   Urinary frequency  UA without any findings to suggest an etiology of his symptoms. He will follow up with his PCP regarding this.        Consultants: Neurology, cardiology Procedures performed: None Disposition: Home Diet recommendation:  Cardiac diet DISCHARGE MEDICATION: Allergies as of 02/17/2024   No Known Allergies      Medication List     PAUSE taking these medications    amLODipine -benazepril 5-10 MG capsule Wait to take this until your doctor or other care provider tells you to start again. Commonly known as: LOTREL Take 1 capsule by mouth daily.       TAKE these medications    aspirin  EC 81 MG tablet Take 1 tablet (81 mg total) by mouth daily. Swallow whole.   metoprolol  tartrate 50 MG tablet Commonly known as: LOPRESSOR  Take 1 tablet (50 mg total) by  mouth 2 (two) times daily.   multivitamin capsule Take 1 capsule by mouth daily.   nicotine  7 mg/24hr patch Commonly known as: NICODERM CQ  - dosed in mg/24 hr Place 7 mg onto the skin daily.   rosuvastatin  40 MG tablet Commonly known as: CRESTOR  Take 1 tablet (40 mg total) by mouth daily.        Follow-up Information     Gollan, Timothy J, MD.  Schedule an appointment as soon as possible for a visit.   Specialty: Cardiology Why: 1-2 weeks Contact information: [address] [place] [address] [phone]         [facility]   Why: 1-2 weeks.               Discharge Exam: Filed Weights   02/14/24 1400 02/14/24 2005 02/17/24 0500  Weight: 81.4 kg 78.2 kg 83.1 kg   Physical Exam  Constitutional: In no distress.  Cardiovascular: Normal rate, regular rhythm. No lower extremity edema  Pulmonary: Non labored breathing on room air, no wheezing or rales.   Abdominal: Soft. Non distended and non tender Musculoskeletal: Normal range of motion.     Neurological: Alert and oriented to person, place, and time. Non focal  Skin: Skin is warm and dry.    Condition at discharge: good  The results of significant diagnostics from this hospitalization (including imaging, microbiology, ancillary and laboratory) are listed below for reference.   Imaging Studies: ECHOCARDIOGRAM COMPLETE BUBBLE STUDY Result Date: 02/15/2024    ECHOCARDIOGRAM REPORT   Patient Name:   Jared Berry Date of Exam: 02/15/2024 Medical Rec #:  [id number]       Height:       71.0 in Accession #:    [id number]      Weight:       172.4 lb Date of Birth:  [birth year]       BSA:          1.980 m Patient Age:    74 years        BP:           143/79 mmHg Patient Gender: M               HR:           71 bpm. Exam Location:  [facility] Procedure: 2D Echo, Cardiac Doppler, Color Doppler and Saline Contrast Bubble            Study (Both Spectral and Color Flow Doppler were utilized during            procedure).                                 MODIFIED REPORT:   This report was modified by Cara JONETTA Lovelace MD on 02/15/2024 due to Negative                                  bubble study.  Indications:     Stroke 434.91 / I63.9  History:         Patient has no prior history of Echocardiogram examinations. No                  medical histories on file.  Sonographer:      Christopher Furnace Referring Phys:  [id number] [facility] Diagnosing Phys: Dwayne D Callwood  MD  Sonographer Comments: Technically challenging study due to limited acoustic windows and no subcostal window. IMPRESSIONS  1. Left ventricular ejection fraction, by estimation, is 60 to 65%. The left ventricle has normal function. The left ventricle has no regional wall motion abnormalities. Left ventricular diastolic parameters are consistent with Grade I diastolic dysfunction (impaired relaxation).  2. Right ventricular systolic function is normal. The right ventricular size is normal.  3. The mitral valve is normal in structure. Trivial mitral valve regurgitation.  4. The aortic valve is normal in structure. Aortic valve regurgitation is not visualized.  5. Agitated saline contrast bubble study was negative, with no evidence of any interatrial shunt. FINDINGS  Left Ventricle: Left ventricular ejection fraction, by estimation, is 60 to 65%. The left ventricle has normal function. The left ventricle has no regional wall motion abnormalities. Strain was performed and the global longitudinal strain is indeterminate. The left ventricular internal cavity size was normal in size. There is no left ventricular hypertrophy. Left ventricular diastolic parameters are consistent with Grade I diastolic dysfunction (impaired relaxation). Right Ventricle: The right ventricular size is normal. No increase in right ventricular wall thickness. Right ventricular systolic function is normal. Left Atrium: Left atrial size was normal in size. Right Atrium: Right atrial size was normal in size. Pericardium: There is no evidence of pericardial effusion. Mitral Valve: The mitral valve is normal in structure. Trivial mitral valve regurgitation. Tricuspid Valve: The tricuspid valve is normal in structure. Tricuspid valve regurgitation is trivial. Aortic Valve: The aortic valve is normal in structure. Aortic valve regurgitation is not visualized. Aortic  valve mean gradient measures 2.0 mmHg. Aortic valve peak gradient measures 4.1 mmHg. Aortic valve area, by VTI measures 3.14 cm. Pulmonic Valve: The pulmonic valve was normal in structure. Pulmonic valve regurgitation is not visualized. Aorta: The ascending aorta was not well visualized. IAS/Shunts: No atrial level shunt detected by color flow Doppler. Agitated saline contrast was given intravenously to evaluate for intracardiac shunting. Agitated saline contrast bubble study was negative, with no evidence of any interatrial shunt. Additional Comments: 3D was performed not requiring image post processing on an independent workstation and was indeterminate.  LEFT VENTRICLE PLAX 2D LVIDd:         4.00 cm   Diastology LVIDs:         2.60 cm   LV e' medial:    11.40 cm/s LV PW:         1.10 cm   LV E/e' medial:  8.3 LV IVS:        1.20 cm   LV e' lateral:   19.60 cm/s LVOT diam:     2.30 cm   LV E/e' lateral: 4.8 LV SV:         33 LV SV Index:   17 LVOT Area:     4.15 cm  RIGHT VENTRICLE RV Basal diam:  2.90 cm RV Mid diam:    2.50 cm RV S prime:     11.60 cm/s TAPSE (M-mode): 2.5 cm LEFT ATRIUM             Index        RIGHT ATRIUM           Index LA Vol (A2C):   22.6 ml 11.42 ml/m  RA Area:     18.90 cm LA Vol (A4C):   16.0 ml 8.08 ml/m   RA Volume:   52.80 ml  26.67 ml/m LA Biplane Vol: 19.7 ml 9.95 ml/m  AORTIC  VALVE AV Area (Vmax):    3.28 cm AV Area (Vmean):   2.93 cm AV Area (VTI):     3.14 cm AV Vmax:           101.00 cm/s AV Vmean:          70.600 cm/s AV VTI:            0.104 m AV Peak Grad:      4.1 mmHg AV Mean Grad:      2.0 mmHg LVOT Vmax:         79.70 cm/s LVOT Vmean:        49.800 cm/s LVOT VTI:          0.079 m LVOT/AV VTI ratio: 0.76  AORTA Ao Root diam: 4.20 cm MITRAL VALVE                TRICUSPID VALVE MV Area (PHT): 7.99 cm     TR Peak grad:   28.1 mmHg MV Decel Time: 95 msec      TR Vmax:        265.00 cm/s MV E velocity: 94.90 cm/s MV A velocity: 122.00 cm/s  SHUNTS MV E/A ratio:   0.78         Systemic VTI:  0.08 m                             Systemic Diam: 2.30 cm Cara JONETTA Lovelace MD Electronically signed by Cara JONETTA Lovelace MD Signature Date/Time: 02/15/2024/12:38:41 PM    Final (Updated)    MR LUMBAR SPINE WO CONTRAST Result Date: 02/15/2024 EXAM: MRI LUMBAR SPINE 02/15/2024 11:06:36 AM TECHNIQUE: Multiplanar multisequence MRI of the lumbar spine was performed without the administration of intravenous contrast. COMPARISON: None available. CLINICAL HISTORY: Lumbar radiculopathy, no red flags, no prior management. FINDINGS: BONES AND ALIGNMENT: There is straightening of the normal lumbar lordosis without significant vertebral column listhesis. There is mild intervertebral disc height and signal loss most pronounced at L5-S1. Normal vertebral body heights. Bone marrow signal is unremarkable. SPINAL CORD: The conus medullaris terminates at the L1-L2 level. SOFT TISSUES: No paraspinal mass. L1-L2: No significant disc herniation. No spinal canal stenosis or neural foraminal narrowing. Mild bilateral facet arthropathy. L2-L3: There is minimal disc bulge and mild bilateral facet arthropathy without significant spinal canal or neural foraminal stenosis. L3-L4: There is minimal disc bulge and mild bilateral facet arthropathy without significant spinal stenosis. There is mild bilateral neural foraminal narrowing. L4-L5: There is disc bulge slightly eccentric to the right and mild bilateral facet arthropathy without significant spinal stenosis. There is mild bilateral neural foraminal narrowing. L5-S1: There is disc bulge eccentric to the right with contact of the descending right S1 nerve roots. Mild bilateral facet arthropathy. No significant spinal stenosis. There is mild right greater than left bilateral neural foraminal narrowing. KIDNEYS: Large right renal T2 hyperintensity may represent a cyst. LIVER: Partially evaluated the right hepatic T2 hyperintensity which may represent a cyst.  IMPRESSION: 1. Right eccentric disc bulge at L5-S1 contacts the descending right S1 nerve root, with mild right greater than left neural foraminal narrowing. 2. Incompletely evaluated right renal and hepatic T2 hyperintensities, which may represent cysts. Electronically signed by: Prentice Spade MD 02/15/2024 12:31 PM EST RP Workstation: [id number]   CT ANGIO HEAD NECK W WO CM Result Date: 02/15/2024 EXAM: CTA Head and Neck with Intravenous Contrast. CT Head without Contrast. CLINICAL HISTORY: 74 year old male with acute neurological  deficit, stroke suspected, presenting with code stroke today. TECHNIQUE: Axial CTA images of the head and neck performed with intravenous contrast. MIP reconstructed images were created and reviewed. Axial computed tomography images of the head/brain performed without intravenous contrast. Note: Per PQRS, the description of internal carotid artery percent stenosis, including 0 percent or normal exam, is based on [place] American Symptomatic Carotid Endarterectomy Trial (NASCET) criteria. Dose reduction technique was used including one or more of the following: automated exposure control, adjustment of mA and kV according to patient size, and/or iterative reconstruction. CONTRAST: With and without. 75 mL iohexol  (OMNIPAQUE ) 350 MG/ML injection. COMPARISON: CT head and brain MRI from 02/14/2024. FINDINGS: CT HEAD: BRAIN: No acute intraparenchymal hemorrhage. No mass lesion. No CT evidence for acute territorial infarct. No midline shift or extra-axial collection. VENTRICLES: No hydrocephalus. ORBITS: The orbits are unremarkable. SINUSES AND MASTOIDS: The paranasal sinuses and mastoid air cells are clear. CTA NECK: AORTA: There is extensive soft plaque atherosclerosis of the distal aortic arch including areas of prominence ulcerated plaque as seen on series 4 image 180 and series 8 images 156 and 171. These plaque ulcerations are generally subcentimeter. No arch dissection is identified.  Superimposed bovine arch configuration, normal variant. COMMON CAROTID ARTERIES: Tortuous right CCA origin. Tortuous bovine left CCA origin. There is anterior left CCA soft plaque ulceration or small subcentimeter pseudoaneurysm best seen on series 10 image 123. No stenosis or dissection associated. Mild brachiocephalic artery soft plaque without stenosis. No significant stenosis. No dissection or occlusion. INTERNAL CAROTID ARTERIES: Partially retropharyngeal right carotid bifurcation, and motion artifact at the right ICA origin and bulb. But no convincing cervical right ICA atherosclerosis or stenosis. Capacious left carotid bifurcation. Minimal left ICA origin plaque. Tortuous cervical left ICA without stenosis. Right ICA siphon is patent with minimal calcified plaque and no stenosis. Normal right posterior communicating artery origin. Left ICA siphon is patent with minimal calcified plaque and no stenosis. Normal left posterior communicating artery origin. Both ophthalmic artery origins appear normal. No stenosis by NASCET criteria. No dissection or occlusion. VERTEBRAL ARTERIES: No proximal subclavian artery or right vertebral artery origin atherosclerosis or stenosis. Dominant right vertebral artery, mildly ectatic to the vertebrobasilar junction and concordant with MRI brain flow voids yesterday. No right vertebral artery stenosis. Proximal left subclavian artery soft plaque without stenosis. 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 No significant stenosis. No dissection or occlusion. CTA HEAD: ANTERIOR CEREBRAL ARTERIES: Left ACA A1 segment is mildly dominant and there is a median artery of the corpus callosum (normal variations). Normal anterior communicating artery. Normal ACA origins. No significant stenosis. No occlusion. No aneurysm. MIDDLE CEREBRAL ARTERIES: Normal MCA origins. No  significant stenosis. No occlusion. No aneurysm. POSTERIOR CEREBRAL ARTERIES: Fetal type bilateral PCA origins, more so the right. No right PCA stenosis. There is mild to moderate left PCA P2 segment irregularity and stenosis on series 11 image 21. No occlusion. No aneurysm. BASILAR ARTERY: Right vertebral artery supplies the basilar artery without stenosis. Basilar artery is patent without stenosis. Bilateral AICA appear to be dominant. No aneurysm. SOFT TISSUES: Partially retropharyngeal course of the right carotid. Motion artifact at the oropharynx and soft palate. Otherwise nonvascular neck soft tissue spaces are within normal limits. BONES: Dentition is absent. Heterogeneous bone mineralization with no destructive or suspicious osseous lesion identified. No acute osseous abnormality. LUNGS: Mild upper lobe lung scarring and atelectasis. INTRACRANIAL VENOUS: Early intracranial venous  contrast timing, the superior sagittal sinus appears to be patent. IMPRESSION: 1. Positive for Extensive distal aortic arch ulcerated soft plaque, without arch dissection (series 4 image 180). And similar anterior left CCA plaque ulceration vs small pseudoaneurysm (series 10 image 123), without dissection or stenosis. 2. Negative for large vessel occlusion , and no significant atherosclerosis or stenosis in the neck. 3. Intracranial atherosclerosis most pronounced in the left PCA p2 segment with mild to moderate irregularity and stenosis there. Electronically signed by: Helayne Hurst MD 02/15/2024 05:53 AM EST RP Workstation: [id number]   MR BRAIN WO CONTRAST Result Date: 02/14/2024 EXAM: MRI Brain Without Contrast 02/14/2024 09:24:29 PM TECHNIQUE: Multiplanar multisequence MRI of the head/brain was performed without the administration of intravenous contrast. COMPARISON: CT Head earlier today. CLINICAL HISTORY: Stroke, follow up FINDINGS: BRAIN AND VENTRICLES: No acute infarct. No acute intracranial hemorrhage. Small area of  superficial siderosis along the right parietal convexity. No mass. No midline shift. Ventriculomegaly, which is mildly out of proportion to sulcal enlargement. The sella is unremarkable. Normal flow voids. ORBITS: No acute abnormality. SINUSES AND MASTOIDS: Mild to moderate paranasal sinus mucosal thickening. BONES AND SOFT TISSUES: Normal marrow signal. No acute soft tissue abnormality. IMPRESSION: 1. No evidence of acute abnormality. 2. Ventriculomegaly, which is mildly out of proportion to sulcal enlargement. This could be due to central predominant atrophy or normal pressure hydrocephalus. 3. Small area of superficial siderosis along the right parietal convexity. Electronically signed by: Gilmore Molt 02/14/2024 09:46 PM EST RP Workstation: [id number]   CT HEAD CODE STROKE WO CONTRAST Result Date: 02/14/2024 EXAM: CT HEAD WITHOUT CONTRAST 02/14/2024 01:25:57 PM TECHNIQUE: CT of the head was performed without the administration of intravenous contrast. Automated exposure control, iterative reconstruction, and/or weight based adjustment of the mA/kV was utilized to reduce the radiation dose to as low as reasonably achievable. COMPARISON: None available. CLINICAL HISTORY: Neuro deficit, acute, stroke suspected. FINDINGS: BRAIN AND VENTRICLES: There is no evidence of an acute infarct, intracranial hemorrhage, mass, midline shift, or extra-axial fluid collection. Mild to moderate enlargement of the lateral and third ventricles without significant temporal horn dilatation is favored to reflect central predominant cerebral atrophy rather than hydrocephalus. Patchy cerebral white matter hypodensities are nonspecific but compatible with mild to moderate chronic small vessel ischemic disease. ORBITS: No acute abnormality. SINUSES: Mild mucosal thickening in the paranasal sinuses. Clear mastoid air cells. SOFT TISSUES AND SKULL: No acute soft tissue abnormality. No skull fracture. [place] Stroke Program Early CT Score  (ASPECTS) ----- Ganglionic (caudate, IC, lentiform nucleus, insula, M1-M3): 7 Supraganglionic (M4-M6): 3 Total: 10 IMPRESSION: 1. No acute intracranial abnormality. ASPECTS of 10. 2. Mild to moderate chronic small vessel ischemic disease. 3. These results were communicated to Dr. CHARLENA Shark at 1:43 PM on 02/14/2024 by secure text page via the [facility] messaging system. Electronically signed by: Dasie Hamburg MD 02/14/2024 01:43 PM EST RP Workstation: [id number]    Microbiology: Results for orders placed or performed during the hospital encounter of 02/14/24  Urine Culture (for pregnant, neutropenic or urologic patients or patients with an indwelling urinary catheter)     Status: Abnormal   Collection Time: 02/15/24  4:30 PM   Specimen: Urine, Clean Catch  Result Value Ref Range Status   Specimen Description   Final    URINE, CLEAN CATCH Performed at [facility], [address], [place], [place] [address]    Special Requests   Final    NONE Performed at [facility], [address] [place],  [place] [address]    Culture MULTIPLE SPECIES PRESENT, SUGGEST RECOLLECTION (A)  Final   Report Status 02/16/2024 FINAL  Final    Labs: CBC: Recent Labs  Lab 02/14/24 1322 02/17/24 0412  WBC 5.9 4.4  NEUTROABS 4.6  --   HGB 14.5 13.2  HCT 43.5 38.6*  MCV 89.1 86.5  PLT 230 216   Basic Metabolic Panel: Recent Labs  Lab 02/14/24 1322 02/14/24 2319 02/15/24 0945 02/16/24 0354  NA 136  --  135 137  K 5.2* 3.7 4.0 3.6  CL 100  --  99 100  CO2 22  --  22 24  GLUCOSE 105*  --  130* 100*  BUN 14  --  11 11  CREATININE 1.31*  --  1.08 1.00  CALCIUM  9.0  --  9.6 8.7*  MG  --   --  2.0 1.9  PHOS  --   --  2.1* 3.3   Liver Function Tests: Recent Labs  Lab 02/14/24 1322  AST 30  ALT 8  ALKPHOS 77  BILITOT 0.6  PROT 7.2  ALBUMIN 3.6   CBG: No results for input(s): GLUCAP in the last 168 hours.  Discharge time spent: greater than 30  minutes.  Signed: Alban Pepper, MD [facility] 02/18/2024 "

## 2024-02-29 ENCOUNTER — Telehealth: Payer: Self-pay | Admitting: Physician Assistant

## 2024-02-29 NOTE — Telephone Encounter (Signed)
 Left voicemail for patient to schedule hospital follow up. Patient was seen inpatient by Bernardino Bring, PA

## 2024-02-29 NOTE — Telephone Encounter (Signed)
-----   Message from Timothy Gollan, MD sent at 02/17/2024  9:58 AM EST ----- Being released from the hospital Needs follow-up in clinic myself or APP Several weeks time Thx TG

## 2024-03-17 NOTE — Telephone Encounter (Signed)
 Called for a follow up appointment LVM
